# Patient Record
Sex: Female | Born: 1975 | Race: White | Hispanic: No | Marital: Single | Smoking: Never smoker
Health system: Southern US, Community
[De-identification: ages and names within clinical notes are randomized; demographics above are authoritative.]

## PROBLEM LIST (undated history)

## (undated) DIAGNOSIS — I7300 Raynaud's syndrome without gangrene: Secondary | ICD-10-CM

## (undated) HISTORY — PX: WISDOM TOOTH EXTRACTION: SHX21

---

## 2013-07-08 ENCOUNTER — Ambulatory Visit: Payer: Self-pay | Admitting: Internal Medicine

## 2014-05-14 ENCOUNTER — Ambulatory Visit: Payer: Self-pay | Admitting: Emergency Medicine

## 2015-01-15 ENCOUNTER — Ambulatory Visit
Admit: 2015-01-15 | Disposition: A | Discharge status: Home / Self Care | Payer: Self-pay | Attending: Family Medicine | Admitting: Family Medicine

## 2015-06-11 ENCOUNTER — Encounter: Payer: Self-pay | Admitting: *Deleted

## 2015-06-11 ENCOUNTER — Emergency Department
Admission: EM | Admit: 2015-06-11 | Discharge: 2015-06-11 | Disposition: A | Discharge status: Home / Self Care | Payer: BLUE CROSS/BLUE SHIELD | Attending: Emergency Medicine | Admitting: Emergency Medicine

## 2015-06-11 ENCOUNTER — Emergency Department: Payer: BLUE CROSS/BLUE SHIELD

## 2015-06-11 DIAGNOSIS — Z3A11 11 weeks gestation of pregnancy: Secondary | ICD-10-CM | POA: Insufficient documentation

## 2015-06-11 DIAGNOSIS — O039 Complete or unspecified spontaneous abortion without complication: Secondary | ICD-10-CM | POA: Diagnosis not present

## 2015-06-11 DIAGNOSIS — O209 Hemorrhage in early pregnancy, unspecified: Secondary | ICD-10-CM | POA: Diagnosis present

## 2015-06-11 LAB — CBC
HCT: 36.2 % (ref 35.0–47.0)
HEMOGLOBIN: 12 g/dL (ref 12.0–16.0)
MCH: 28.5 pg (ref 26.0–34.0)
MCHC: 33.1 g/dL (ref 32.0–36.0)
MCV: 86 fL (ref 80.0–100.0)
PLATELETS: 220 10*3/uL (ref 150–440)
RBC: 4.21 MIL/uL (ref 3.80–5.20)
RDW: 13 % (ref 11.5–14.5)
WBC: 9.7 10*3/uL (ref 3.6–11.0)

## 2015-06-11 LAB — HCG, QUANTITATIVE, PREGNANCY: HCG, BETA CHAIN, QUANT, S: 30606 m[IU]/mL — AB (ref <5)

## 2015-06-11 NOTE — ED Notes (Signed)
NAD noted at time of D/C. Pt taken to the lobby via wheelchair. SO at bedside for D/C.

## 2015-06-11 NOTE — Discharge Instructions (Signed)
Miscarriage  Please follow up closely with [facility] obstetrics within the next few days.  A miscarriage is the sudden loss of an unborn baby (fetus) before the 20th week of pregnancy. Most miscarriages happen in the first 3 months of pregnancy. Sometimes, it happens before a woman even knows she is pregnant. A miscarriage is also called a "spontaneous miscarriage" or "early pregnancy loss." Having a miscarriage can be an emotional experience. Talk with your caregiver about any questions you may have about miscarrying, the grieving process, and your future pregnancy plans. CAUSES   Problems with the fetal chromosomes that make it impossible for the baby to develop normally. Problems with the baby's genes or chromosomes are most often the result of errors that occur, by chance, as the embryo divides and grows. The problems are not inherited from the parents.  Infection of the cervix or uterus.   Hormone problems.   Problems with the cervix, such as having an incompetent cervix. This is when the tissue in the cervix is not strong enough to hold the pregnancy.   Problems with the uterus, such as an abnormally shaped uterus, uterine fibroids, or congenital abnormalities.   Certain medical conditions.   Smoking, drinking alcohol, or taking illegal drugs.   Trauma.  Often, the cause of a miscarriage is unknown.  SYMPTOMS   Vaginal bleeding or spotting, with or without cramps or pain.  Pain or cramping in the abdomen or lower back.  Passing fluid, tissue, or blood clots from the vagina. DIAGNOSIS  Your caregiver will perform a physical exam. You may also have an ultrasound to confirm the miscarriage. Blood or urine tests may also be ordered. TREATMENT   Sometimes, treatment is not necessary if you naturally pass all the fetal tissue that was in the uterus. If some of the fetus or placenta remains in the body (incomplete miscarriage), tissue left behind may become infected and must be  removed. Usually, a dilation and curettage (D and C) procedure is performed. During a D and C procedure, the cervix is widened (dilated) and any remaining fetal or placental tissue is gently removed from the uterus.  Antibiotic medicines are prescribed if there is an infection. Other medicines may be given to reduce the size of the uterus (contract) if there is a lot of bleeding.  If you have Rh negative blood and your baby was Rh positive, you will need a Rh immunoglobulin shot. This shot will protect any future baby from having Rh blood problems in future pregnancies. HOME CARE INSTRUCTIONS   Your caregiver may order bed rest or may allow you to continue light activity. Resume activity as directed by your caregiver.  Have someone help with home and family responsibilities during this time.   Keep track of the number of sanitary pads you use each day and how soaked (saturated) they are. Write down this information.   Do not use tampons. Do not douche or have sexual intercourse until approved by your caregiver.   Only take over-the-counter or prescription medicines for pain or discomfort as directed by your caregiver.   Do not take aspirin. Aspirin can cause bleeding.   Keep all follow-up appointments with your caregiver.   If you or your partner have problems with grieving, talk to your caregiver or seek counseling to help cope with the pregnancy loss. Allow enough time to grieve before trying to get pregnant again.  SEEK IMMEDIATE MEDICAL CARE IF:   You have severe cramps or pain in your back  or abdomen.  You have a fever.  You pass large blood clots (walnut-sized or larger) ortissue from your vagina. Save any tissue for your caregiver to inspect.   Your bleeding increases.   You have a thick, bad-smelling vaginal discharge.  You become lightheaded, weak, or you faint.   You have chills.  MAKE SURE YOU:  Understand these instructions.  Will watch your  condition.  Will get help right away if you are not doing well or get worse. Document Released: 03/23/2001 Document Revised: 01/22/2013 Document Reviewed: 11/16/2011 [facility] Patient Information 2015 [place], [place]. This information is not intended to replace advice given to you by your health care provider. Make sure you discuss any questions you have with your health care provider.

## 2015-06-11 NOTE — ED Notes (Signed)
Pt returned from US

## 2015-06-11 NOTE — ED Provider Notes (Signed)
[facility] Emergency Department Provider Note  ____________________________________________  Time seen: Approximately 10:49 AM  I have reviewed the triage vital signs and the nursing notes.   HISTORY  Chief Complaint Miscarriage    HPI Cindy Edwards is a 39 y.o. female under the care of midwives at [facility]. She presents today after passing what she describes as a "fetus" at approximately 8 AM. She reports that she woke up this morningwith vaginal bleeding at a rate of about 1 pad per hour, this was associated with passage of a large clot in fetus. She reports she been having some crampy lower abdominal pain since that time, but does not wish any pain medicine.  She reports that she had no ultrasound of the baby last week after having 2 days of vaginal spotting, but was told this was normal. They did not know what caused the bleeding and she did receive a shot of RhoGAM at that time.  No fevers or chills. No nausea no vomiting. Having crampy lower abdominal pain and spotting: About 1 pad per hour for the last 2-3 hours.   History reviewed. No pertinent past medical history.  There are no active problems to display for this patient.   History reviewed. No pertinent past surgical history.  No current outpatient prescriptions on file.  Allergies Review of patient's allergies indicates no known allergies.  No family history on file.  Social History Social History  Substance Use Topics  . Smoking status: Never Smoker   . Smokeless tobacco: None  . Alcohol Use: No    Review of Systems Constitutional: No fever/chills Eyes: No visual changes. ENT: No sore throat. Cardiovascular: Denies chest pain. Respiratory: Denies shortness of breath. Gastrointestinal: No abdominal pain.  No nausea, no vomiting.  No diarrhea.  No constipation. Genitourinary: Negative for dysuria. Does report vaginal bleeding: About 1 pad per hour for the last 3  hours. Musculoskeletal: Negative for back pain. Skin: Negative for rash. Neurological: Negative for headaches, focal weakness or numbness.  10-point ROS otherwise negative.  ____________________________________________   PHYSICAL EXAM:  VITAL SIGNS: ED Triage Vitals  Enc Vitals Group     BP 06/11/15 0907 93/51 mmHg     Pulse Rate 06/11/15 0907 88     Resp 06/11/15 0907 20     Temp 06/11/15 0907 97.6 F (36.4 C)     Temp Source 06/11/15 0907 Oral     SpO2 06/11/15 0907 100 %     Weight 06/11/15 0907 157 lb (71.215 kg)     Height 06/11/15 0907  (1.651 m)     Head Cir --      Peak Flow --      Pain Score 06/11/15 0907 10     Pain Loc --      Pain Edu? --      Excl. in GC? --     Constitutional: Alert and oriented. Well appearing and in no acute distress. Eyes: Conjunctivae are normal. PERRL. EOMI. Head: Atraumatic. Nose: No congestion/rhinnorhea. Mouth/Throat: Mucous membranes are moist.  Oropharynx non-erythematous. Neck: No stridor.   Cardiovascular: Normal rate, regular rhythm. Grossly normal heart sounds.  Good peripheral circulation. Respiratory: Normal respiratory effort.  No retractions. Lungs CTAB. Gastrointestinal: Soft and nontender except for moderate crampy pain to palpation in the suprapubic region. No distention. No abdominal bruits. No CVA tenderness. Musculoskeletal: No lower extremity tenderness nor edema.  No joint effusions. Neurologic:  Normal speech and language. No gross focal neurologic deficits are appreciated.  No gait instability. Skin:  Skin is warm, dry and intact. No rash noted. Psychiatric: Mood and affect are normal. Speech and behavior are normal.  ____________________________________________   LABS (all labs ordered are listed, but only abnormal results are displayed)  Labs Reviewed  HCG, QUANTITATIVE, PREGNANCY - Abnormal; Notable for the following:    hCG, Beta Chain, Quant, S 30606 (*)    All other components within normal  limits  CBC  SURGICAL PATHOLOGY   ____________________________________________  EKG   ____________________________________________  RADIOLOGY  IMPRESSION: 1. Irregular endometrial contents consistent with retained products of conception in the setting of recent spontaneous abortion. 2. Small fluid collection within the endometrium is noted. This appearance could be seen in the setting of an early pregnancy and therefore correlation with history is critical. 3. No suspicious adnexal mass. 4. The salient findings were discussed with Shterna Laramee on 06/11/2015 at 1:18 pm. ____________________________________________   PROCEDURES  Procedure(s) performed: None  Critical Care performed: No  ____________________________________________   INITIAL IMPRESSION / ASSESSMENT AND PLAN / ED COURSE  Pertinent labs & imaging results that were available during my care of the patient were reviewed by me and considered in my medical decision making (see chart for details).  Patient presents with possible miscarriage. Reports a previous ultrasound confirming intrauterine pregnancy week ago. Her symptoms are most concerning for a miscarriage, we will obtain hCG and repeat ultrasound. She is hemodynamically stable and her hemoglobin is normal. No evidence of significant bleeding beyond what may be expected for possible miscarriage. We'll obtain repeat ultrasound imaging, and have sent a specimen which the patient presented with to pathology for evaluation.  Careful return precautions advised, follow up care with [facility] obstetrics within the next 1-2 days advised. Patient and boyfriend agreeable with plan.  [facility] chart reviewed and the patient was administered Rhogam on 8/29.  ----------------------------------------- 1:54 PM on 06/11/2015 -----------------------------------------  Patient awake alert no distress. Result is reviewed and follow-up care with [facility] midwives discussed with patient is very  agreeable. Careful return precautions discussed.  In the setting of her clinical history with the 11 week pregnancy, this appears consistent with miscarriage, but I did discuss the importance of follow-up with the patient. In addition I reviewed ultrasound, labs and clinical history with Dr. Chauncey Cruel also advised this appears consistent with miscarriage. ____________________________________________   FINAL CLINICAL IMPRESSION(S) / ED DIAGNOSES  Final diagnoses:  Spontaneous miscarriage      Sharyn Creamer, MD 06/11/15 1356

## 2015-06-11 NOTE — ED Notes (Signed)
Pt to US.

## 2015-06-11 NOTE — ED Notes (Signed)
Pt is [redacted] weeks pregnant, pt starting with vaginal bleeding Sunday, pt passed fetus and brought it with her to ER, pt is bleeding and cramping

## 2015-06-12 LAB — SURGICAL PATHOLOGY

## 2016-05-12 ENCOUNTER — Ambulatory Visit
Admission: EM | Admit: 2016-05-12 | Discharge: 2016-05-12 | Disposition: A | Discharge status: Home / Self Care | Payer: BLUE CROSS/BLUE SHIELD | Attending: Emergency Medicine | Admitting: Emergency Medicine

## 2016-05-12 ENCOUNTER — Encounter: Payer: Self-pay | Admitting: Emergency Medicine

## 2016-05-12 DIAGNOSIS — L03317 Cellulitis of buttock: Secondary | ICD-10-CM

## 2016-05-12 HISTORY — DX: Raynaud's syndrome without gangrene: I73.00

## 2016-05-12 MED ORDER — SULFAMETHOXAZOLE-TRIMETHOPRIM 800-160 MG PO TABS: 2.0000 | ORAL_TABLET | Freq: Two times a day (BID) | ORAL | 0 refills | Status: DC

## 2016-05-12 MED ORDER — IBUPROFEN 800 MG PO TABS: 800.0000 mg | ORAL_TABLET | Freq: Three times a day (TID) | ORAL | 0 refills | Status: DC

## 2016-05-12 NOTE — ED Triage Notes (Signed)
Pt reports she may have a tick bite on buttocks, and larger than it was. Thinks she has had it for about 2 weeks. Did not pull a tick off this spot but has pulled several other ticks off of herself and her dog.  Pt denies fever or chills.

## 2016-05-12 NOTE — ED Provider Notes (Signed)
HPI  SUBJECTIVE:  Cindy Edwards is a 40 y.o. female who presents with an erythematous mass of gradually increasing size over her right buttock for the past 2 weeks. She initially thought it was a pimple, but is concerned because it is getting larger and it "has not come to a head". She denies any pain. 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 Pulled several ticks off herself about 2 or 3 weeks ago on her scalp, abdomen, ankle. She denies fevers, bodyaches, rash, headache, neck stiffness. She denies any trauma to the area. No conjunctival injection. No antipyretic in the past 6-8 hours. There are no aggravating or alleviating factors. She has tried warm showers for this. Past medical history of Raynaud's and a erythema migrans post tick bite several years ago. She does not have any history of Lyme disease, RMSF. No history of diabetes, hypertension. PMD: Dr.Skaria. LMP: Started Yesterday. She denies any possibility of being pregnant.  Past Medical History:  Diagnosis Date  . Raynaud's phenomenon     History reviewed. No pertinent surgical history.  History reviewed. No pertinent family history.  Social History  Substance Use Topics  . Smoking status: Never Smoker  . Smokeless tobacco: Never Used  . Alcohol use No    No current facility-administered medications for this encounter.   Current Outpatient Prescriptions:  .  Prenatal Multivit-Min-Fe-FA (PRENATAL VITAMINS PO), Take by mouth., Disp: , Rfl:  .  ibuprofen (ADVIL,MOTRIN) 800 MG tablet, Take 1 tablet (800 mg total) by mouth 3 (three) times daily., Disp: 30 tablet, Rfl: 0 .  sulfamethoxazole-trimethoprim (BACTRIM DS,SEPTRA DS) 800-160 MG tablet, Take 2 tablets by mouth 2 (two) times daily., Disp: 28 tablet, Rfl: 0  No Known Allergies   ROS  As noted in HPI.   Physical Exam  BP 127/80   Pulse 83   Temp 98.5 F (36.9 C) (Oral)   Resp 16   Ht 5\' 5"  (1.651  m)   Wt 165 lb (74.8 kg)   LMP 05/11/2016   SpO2 100%   BMI 27.46 kg/m   Constitutional: Well developed, well nourished, no acute distress Eyes:  EOMI, conjunctiva normal bilaterally HENT: Normocephalic, atraumatic,mucus membranes moist Respiratory: Normal inspiratory effort Cardiovascular: Normal rate GI: nondistended skin: Mildly tender area of erythema with minimal induration on her right buttock. Erythema measures approximately 3 x 2.5 cm. This was marked with a marker for reference. She has central erythema with a hyperpigmented center. No pustule. No bull's-eye rash. No expressible purulent drainage. Musculoskeletal: no deformities Neurologic: Alert & oriented x 3, no focal neuro deficits Psychiatric: Speech and behavior appropriate   ED Course   Medications - No data to display  No orders of the defined types were placed in this encounter.   No results found for this or any previous visit (from the past 24 hour(s)). No results found.  ED Clinical Impression  Cellulitis of buttock   ED Assessment/Plan  No bull's-eye rash. No signs or symptom of a tickborne disease at this time. Presentation is most consistent with a small cellulitis, perhaps an abscess. Discussed possibility of doing a needle I&D today, however, patient has opted to do ibuprofen and Bactrim DS twice a day for 7 days,, warm soaks. She'll follow-up here or with her primary care physician if she is not getting significantly better. She will return to her primary care physician or here sooner if she starts getting worse. Discussed signs  and symptoms that should prompt return to the emergency department. Patient agrees with plan.    *This clinic note was created using Dragon dictation software. Therefore, there may be occasional mistakes despite careful proofreading.  ?   Domenick Gong, MD 05/12/16 [phone]

## 2016-09-25 ENCOUNTER — Ambulatory Visit
Admission: EM | Admit: 2016-09-25 | Discharge: 2016-09-25 | Disposition: A | Discharge status: Home / Self Care | Payer: BLUE CROSS/BLUE SHIELD | Attending: Family Medicine | Admitting: Family Medicine

## 2016-09-25 ENCOUNTER — Encounter: Payer: Self-pay | Admitting: Emergency Medicine

## 2016-09-25 DIAGNOSIS — L509 Urticaria, unspecified: Secondary | ICD-10-CM

## 2016-09-25 MED ORDER — PREDNISONE 10 MG PO TABS: 10.0000 mg | ORAL_TABLET | Freq: Every day | ORAL | 0 refills | Status: DC

## 2016-09-25 NOTE — ED Provider Notes (Signed)
[facility]    CSN: [id number] Arrival date & time: 09/25/16  1448     History   Chief Complaint Chief Complaint  Patient presents with  . Allergic Reaction    HPI Cindy Edwards is a 40 y.o. female.   The history is provided by the patient.    Past Medical History:  Diagnosis Date  . Raynaud's phenomenon     There are no active problems to display for this patient.   History reviewed. No pertinent surgical history.  OB History    No data available       Home Medications    Prior to Admission medications   Medication Sig Start Date End Date Taking? Authorizing Provider  ibuprofen (ADVIL,MOTRIN) 800 MG tablet Take 1 tablet (800 mg total) by mouth 3 (three) times daily. 05/12/16   Domenick GongAshley Mortenson, MD  predniSONE (DELTASONE) 10 MG tablet Take 1 tablet (10 mg total) by mouth daily with breakfast. 09/25/16   Duanne Limerickeanna C Jones, MD    Family History History reviewed. No pertinent family history.  Social History Social History  Substance Use Topics  . Smoking status: Never Smoker  . Smokeless tobacco: Never Used  . Alcohol use No     Allergies   Patient has no known allergies.   Review of Systems Review of Systems  Constitutional: Negative.  Negative for chills, fatigue, fever and unexpected weight change.  HENT: Negative for congestion, ear discharge, ear pain, rhinorrhea, sinus pressure, sneezing and sore throat.   Eyes: Negative for photophobia, pain, discharge, redness and itching.  Respiratory: Negative for cough, shortness of breath, wheezing and stridor.   Gastrointestinal: Negative for abdominal pain, blood in stool, constipation, diarrhea, nausea and vomiting.  Endocrine: Negative for cold intolerance, heat intolerance, polydipsia, polyphagia and polyuria.  Genitourinary: Negative for dysuria, flank pain, frequency, hematuria, menstrual problem, pelvic pain, urgency, vaginal bleeding and vaginal discharge.  Musculoskeletal: Negative  for arthralgias, back pain and myalgias.  Skin: Negative for rash.  Allergic/Immunologic: Negative for environmental allergies and food allergies.  Neurological: Negative for dizziness, weakness, light-headedness, numbness and headaches.  Hematological: Negative for adenopathy. Does not bruise/bleed easily.  Psychiatric/Behavioral: Negative for dysphoric mood. The patient is not nervous/anxious.      Physical Exam Triage Vital Signs ED Triage Vitals  Enc Vitals Group     BP 09/25/16 1506 (!) 104/59     Pulse Rate 09/25/16 1506 86     Resp 09/25/16 1506 17     Temp 09/25/16 1506 97.5 F (36.4 C)     Temp Source 09/25/16 1506 Oral     SpO2 09/25/16 1506 95 %     Weight 09/25/16 1505 165 lb (74.8 kg)     Height 09/25/16 1505 5\' 5"  (1.651 m)     Head Circumference --      Peak Flow --      Pain Score 09/25/16 1508 0     Pain Loc --      Pain Edu? --      Excl. in GC? --    No data found.   Updated Vital Signs BP (!) 104/59 (BP Location: Left Arm)   Pulse 86   Temp 97.5 F (36.4 C) (Oral)   Resp 17   Ht 5\' 5"  (1.651 m)   Wt 165 lb (74.8 kg)   LMP 09/21/2016 (Exact Date)   SpO2 95%   BMI 27.46 kg/m   Visual Acuity Right Eye Distance:   Left Eye Distance:  Bilateral Distance:    Right Eye Near:   Left Eye Near:    Bilateral Near:     Physical Exam  Constitutional: No distress.  HENT:  Head: Normocephalic and atraumatic.  Right Ear: External ear normal.  Left Ear: External ear normal.  Nose: Nose normal.  Mouth/Throat: Oropharynx is clear and moist.  Eyes: Conjunctivae and EOM are normal. Pupils are equal, round, and reactive to light. Right eye exhibits no discharge. Left eye exhibits no discharge.  Neck: Normal range of motion. Neck supple. No JVD present. No thyromegaly present.  Cardiovascular: Normal rate, regular rhythm, normal heart sounds and intact distal pulses.  Exam reveals no gallop and no friction rub.   No murmur heard. Pulmonary/Chest: Effort  normal and breath sounds normal.  Abdominal: Soft. Bowel sounds are normal. She exhibits no mass. There is no tenderness. There is no guarding.  Musculoskeletal: Normal range of motion. She exhibits no edema.  Lymphadenopathy:    She has no cervical adenopathy.  Neurological: She is alert. She has normal reflexes.  Skin: Skin is warm and dry. Rash noted. She is not diaphoretic.     UC Treatments / Results  Labs (all labs ordered are listed, but only abnormal results are displayed) Labs Reviewed - No data to display  EKG  EKG Interpretation None       Radiology No results found.  Procedures Procedures (including critical care time)  Medications Ordered in UC Medications - No data to display   Initial Impression / Assessment and Plan / UC Course  I have reviewed the triage vital signs and the nursing notes.  Pertinent labs & imaging results that were available during my care of the patient were reviewed by me and considered in my medical decision making (see chart for details).  Clinical Course     I spent 15 minutes with this patient, More than 50% of that time was spent in face to face education, counseling and care coordination.  Final Clinical Impressions(s) / UC Diagnoses   Final diagnoses:  Urticaria    New Prescriptions New Prescriptions   PREDNISONE (DELTASONE) 10 MG TABLET    Take 1 tablet (10 mg total) by mouth daily with breakfast.     Duanne Limerickeanna C Jones, MD 09/25/16 [phone]

## 2016-09-25 NOTE — ED Triage Notes (Signed)
Patient c/o itchy rash that started around 2pm today on her abdomen and chest.  Patient took 2 tablets Benadryl 45 min ago.

## 2016-09-25 NOTE — ED Triage Notes (Signed)
Patient states that today she took a bath with a new bubble bath and essential oils.

## 2016-10-12 ENCOUNTER — Ambulatory Visit
Admission: EM | Admit: 2016-10-12 | Discharge: 2016-10-12 | Disposition: A | Discharge status: Home / Self Care | Payer: BLUE CROSS/BLUE SHIELD | Attending: Family Medicine | Admitting: Family Medicine

## 2016-10-12 ENCOUNTER — Ambulatory Visit (INDEPENDENT_AMBULATORY_CARE_PROVIDER_SITE_OTHER): Payer: BLUE CROSS/BLUE SHIELD

## 2016-10-12 DIAGNOSIS — R0789 Other chest pain: Secondary | ICD-10-CM

## 2016-10-12 DIAGNOSIS — R938 Abnormal findings on diagnostic imaging of other specified body structures: Secondary | ICD-10-CM

## 2016-10-12 DIAGNOSIS — R9389 Abnormal findings on diagnostic imaging of other specified body structures: Secondary | ICD-10-CM

## 2016-10-12 MED ORDER — KETOROLAC TROMETHAMINE 60 MG/2ML IM SOLN: 60.0000 mg | Freq: Once | INTRAMUSCULAR | Status: DC

## 2016-10-12 MED ORDER — MELOXICAM 15 MG PO TABS: 15.0000 mg | ORAL_TABLET | Freq: Every day | ORAL | 0 refills | Status: DC

## 2016-10-12 MED ORDER — KETOROLAC TROMETHAMINE 60 MG/2ML IM SOLN
60.0000 mg | Freq: Once | INTRAMUSCULAR | Status: AC
  Administered 2016-10-12: 60 mg via INTRAMUSCULAR

## 2016-10-12 NOTE — ED Provider Notes (Signed)
[facility]    CSN: [id number] Arrival date & time: 10/12/16  1148     History   Chief Complaint Chief Complaint  Patient presents with  . Chest Pain    HPI Cindy Edwards is a 41 y.o. female.   Patient is a 41 year old white female with a history of Raynaud's phenomena who's been having left-sided chest wall pain will last week. Last 2-3 days is A lot worse. She has had a history of chest wall pain and costochondritis before. She states that she [facility] when she had it before. She reports Motrin didn't seem to do much for it. This pain started about 2 days ago has gotten progressively worse this morning was much worse. She reports when she states still doesn't move much and taking deep breaths the pain is not too bad in touch takes a deep breath is hurts quite a bit. She is not allergic to any medication. She does not smoke. No pertinent surgical history. No significant past family medical history relevant to today's visit.   The history is provided by the patient. No language interpreter was used.  Chest Pain  Pain location:  Substernal area, L chest and L lateral chest Pain quality: pressure   Pain radiates to:  Does not radiate Pain severity:  Moderate Timing:  Constant Progression:  Worsening Chronicity:  New Relieved by:  Nothing Worsened by:  Nothing Ineffective treatments:  None tried Associated symptoms: abdominal pain and shortness of breath     Past Medical History:  Diagnosis Date  . Raynaud's phenomenon     There are no active problems to display for this patient.   History reviewed. No pertinent surgical history.  OB History    No data available       Home Medications    Prior to Admission medications   Medication Sig Start Date End Date Taking? Authorizing Provider  ibuprofen (ADVIL,MOTRIN) 800 MG tablet Take 1 tablet (800 mg total) by mouth 3 (three) times daily. 05/12/16  Yes Domenick Gong, MD    meloxicam (MOBIC) 15 MG tablet Take 1 tablet (15 mg total) by mouth daily. 10/12/16   Hassan Rowan, MD  predniSONE (DELTASONE) 10 MG tablet Take 1 tablet (10 mg total) by mouth daily with breakfast. 09/25/16   Duanne Limerick, MD    Family History History reviewed. No pertinent family history.  Social History Social History  Substance Use Topics  . Smoking status: Never Smoker  . Smokeless tobacco: Never Used  . Alcohol use No     Allergies   Patient has no known allergies.   Review of Systems Review of Systems  Respiratory: Positive for shortness of breath.   Cardiovascular: Positive for chest pain.  Gastrointestinal: Positive for abdominal pain.  All other systems reviewed and are negative.    Physical Exam Triage Vital Signs ED Triage Vitals  Enc Vitals Group     BP 10/12/16 1324 111/69     Pulse Rate 10/12/16 1324 74     Resp 10/12/16 1324 16     Temp 10/12/16 1324 98.8 F (37.1 C)     Temp Source 10/12/16 1324 Oral     SpO2 10/12/16 1324 100 %     Weight 10/12/16 1322 165 lb (74.8 kg)     Height 10/12/16 1322 5\' 5"  (1.651 m)     Head Circumference --      Peak Flow --      Pain  Score 10/12/16 1322 8     Pain Loc --      Pain Edu? --      Excl. in GC? --    No data found.   Updated Vital Signs BP 111/69 (BP Location: Right Arm)   Pulse 74   Temp 98.8 F (37.1 C) (Oral)   Resp 16   Ht 5\' 5"  (1.651 m)   Wt 165 lb (74.8 kg)   LMP 09/21/2016 (Exact Date) Comment: denies preg  SpO2 100%   BMI 27.46 kg/m   Visual Acuity Right Eye Distance:   Left Eye Distance:   Bilateral Distance:    Right Eye Near:   Left Eye Near:    Bilateral Near:     Physical Exam  Constitutional: She is oriented to person, place, and time. She appears well-developed and well-nourished. No distress.  HENT:  Head: Normocephalic and atraumatic.  Right Ear: External ear normal.  Left Ear: External ear normal.  Eyes: Pupils are equal, round, and reactive to light.  Neck:  Normal range of motion. Neck supple.  Cardiovascular: Normal rate and regular rhythm.   Pulmonary/Chest: Effort normal and breath sounds normal. She has no decreased breath sounds. She has no wheezes. She has no rhonchi. She exhibits tenderness, bony tenderness and swelling.    Patient has tenderness on the left chest wall. Patient was so tender I couldn't even examine her well says that tried to touch her left chest wall to literally jumping off  Musculoskeletal: Normal range of motion.  Neurological: She is alert and oriented to person, place, and time.  Skin: Skin is warm. She is not diaphoretic.  Vitals reviewed.    UC Treatments / Results  Labs (all labs ordered are listed, but only abnormal results are displayed) Labs Reviewed - No data to display  EKG  EKG Interpretation None       Radiology Dg Ribs Unilateral W/chest Left  Result Date: 10/12/2016 CLINICAL DATA:  Left mid to lateral rib pain. EXAM: LEFT RIBS AND CHEST - 3+ VIEW COMPARISON:  None. FINDINGS: The lungs are clear wiithout focal pneumonia, edema, pneumothorax or pleural effusion. The cardiopericardial silhouette is within normal limits for size. Oblique views the left ribs were obtained. Radio-opaque marker has been placed on the skin at the site of patient concern. No evidence for lower left-sided rib fracture. 4 x 8 mm dense nodule left lung base compatible with granuloma. Rounded calcification in the left upper quadrant of the abdomen may be related to a kidney stone or splenic granuloma. IMPRESSION: 1. No acute cardiopulmonary findings. 2. No evidence for lower left rib fracture. Electronically Signed   By: Kennith Center M.D.   On: 10/12/2016 13:59    Procedures Procedures (including critical care time)  Medications Ordered in UC Medications  ketorolac (TORADOL) injection 60 mg (60 mg Intramuscular Given 10/12/16 1426)     Initial Impression / Assessment and Plan / UC Course  I have reviewed the triage  vital signs and the nursing notes.  Pertinent labs & imaging results that were available during my care of the patient were reviewed by me and considered in my medical decision making (see chart for details).  Clinical Course     X-ray shows good granulomas present in the chest x-ray I've strongly recommend since she's never been told she had granulomas she follow up with her PCP and be evaluated for TB and other issues make sure this is not a factor in the granuloma. For  now will treat with Mobic 15 mg 1 tablet a day because the mild discomfort she is having emotional states she still is about a 6 out of 10 is obvious when she moves she has somewhat pain we'll give her 60 Toradol IM. We'll given for today Tuesday and Wednesday. Since she has no pain over that area and only when she moves EKG was not obtained  Final Clinical Impressions(s) / UC Diagnoses   Final diagnoses:  Atypical chest pain  Chest wall pain  Abnormal CXR (chest x-ray)    New Prescriptions Discharge Medication List as of 10/12/2016  2:37 PM      Note: This dictation was prepared with Dragon dictation along with smaller phrase technology. Any transcriptional errors that result from this process are unintentional.    Hassan RowanEugene Jenay Morici, MD 10/12/16 1520

## 2016-10-12 NOTE — Discharge Instructions (Signed)
Follow-up with PCP to evaluate patient for granulomas that was seen on chest x-ray as an incidental finding

## 2016-10-12 NOTE — ED Triage Notes (Signed)
Patient complains of possible costochondritis that started around 1 week ago and has been worsening. Patient states that she has pain in her left rib area with movement. Patient states that she had several problems with things jerking her and could have caused the pain. Patient states that pain is also worth with deep breath.

## 2017-03-14 ENCOUNTER — Ambulatory Visit (INDEPENDENT_AMBULATORY_CARE_PROVIDER_SITE_OTHER): Payer: BLUE CROSS/BLUE SHIELD

## 2017-03-14 ENCOUNTER — Ambulatory Visit
Admission: EM | Admit: 2017-03-14 | Discharge: 2017-03-14 | Disposition: A | Discharge status: Home / Self Care | Payer: BLUE CROSS/BLUE SHIELD | Attending: Family Medicine | Admitting: Family Medicine

## 2017-03-14 DIAGNOSIS — R21 Rash and other nonspecific skin eruption: Secondary | ICD-10-CM

## 2017-03-14 DIAGNOSIS — L237 Allergic contact dermatitis due to plants, except food: Secondary | ICD-10-CM | POA: Diagnosis not present

## 2017-03-14 DIAGNOSIS — Z23 Encounter for immunization: Secondary | ICD-10-CM | POA: Diagnosis not present

## 2017-03-14 DIAGNOSIS — M79602 Pain in left arm: Secondary | ICD-10-CM

## 2017-03-14 DIAGNOSIS — S30860A Insect bite (nonvenomous) of lower back and pelvis, initial encounter: Secondary | ICD-10-CM | POA: Diagnosis not present

## 2017-03-14 DIAGNOSIS — W57XXXA Bitten or stung by nonvenomous insect and other nonvenomous arthropods, initial encounter: Secondary | ICD-10-CM | POA: Diagnosis not present

## 2017-03-14 LAB — CBC WITH DIFFERENTIAL/PLATELET
BASOS ABS: 0.1 10*3/uL (ref 0–0.1)
Basophils Relative: 1 %
Eosinophils Absolute: 0 10*3/uL (ref 0–0.7)
Eosinophils Relative: 0 %
HEMATOCRIT: 40.3 % (ref 35.0–47.0)
Hemoglobin: 13.5 g/dL (ref 12.0–16.0)
LYMPHS ABS: 0.7 10*3/uL — AB (ref 1.0–3.6)
Lymphocytes Relative: 12 %
MCH: 28.8 pg (ref 26.0–34.0)
MCHC: 33.5 g/dL (ref 32.0–36.0)
MCV: 85.9 fL (ref 80.0–100.0)
Monocytes Absolute: 0.3 10*3/uL (ref 0.2–0.9)
Monocytes Relative: 5 %
NEUTROS ABS: 4.7 10*3/uL (ref 1.4–6.5)
Neutrophils Relative %: 82 %
Platelets: 204 10*3/uL (ref 150–440)
RBC: 4.69 MIL/uL (ref 3.80–5.20)
RDW: 13.7 % (ref 11.5–14.5)
WBC: 5.8 10*3/uL (ref 3.6–11.0)

## 2017-03-14 MED ORDER — PREDNISONE 10 MG (21) PO TBPK: ORAL_TABLET | ORAL | 0 refills | Status: DC

## 2017-03-14 MED ORDER — MELOXICAM 15 MG PO TABS: 15.0000 mg | ORAL_TABLET | Freq: Every day | ORAL | 0 refills | Status: DC

## 2017-03-14 MED ORDER — TETANUS-DIPHTH-ACELL PERTUSSIS 5-2.5-18.5 LF-MCG/0.5 IM SUSP
0.5000 mL | Freq: Once | INTRAMUSCULAR | Status: AC
  Administered 2017-03-14: 0.5 mL via INTRAMUSCULAR

## 2017-03-14 MED ORDER — KETOROLAC TROMETHAMINE 60 MG/2ML IM SOLN
60.0000 mg | Freq: Once | INTRAMUSCULAR | Status: AC
  Administered 2017-03-14: 60 mg via INTRAMUSCULAR

## 2017-03-14 MED ORDER — DOXYCYCLINE HYCLATE 100 MG PO CAPS: 100.0000 mg | ORAL_CAPSULE | Freq: Two times a day (BID) | ORAL | 0 refills | Status: DC

## 2017-03-14 NOTE — ED Triage Notes (Signed)
Pt reports she thought she pulled muscle in her left forearm (works as a Visual merchandiserfarmer) and has had fever/chills since Saturday.Right lower back reports poison ivy and right upper back also has an area of breakout.

## 2017-03-14 NOTE — ED Provider Notes (Signed)
[facility]    CSN: [id number] Arrival date & time: 03/14/17  1229     History   Chief Complaint Chief Complaint  Patient presents with  . Fever  . Arm Pain    HPI Cindy Edwards is a 41 y.o. female.   Patient presents with multiple issues multiple problems. She reports having exposure to combat and recently undergoing rabies prophylaxis there was no bile bite L. She reports also poison ivy or poison oak contact as well she is using cortisone cream for lesion in her upper right hip. She states that she'sa new lesion on her right upper back which she thinks is insect bite. She found a engorged tick in her bed about 3 days ago she flushed down the toilet and she found a rather large, portion of the head yesterday. She's been having a fever chills and she's also has had squeezing pain in her right forearm as well. 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 She denies any direct trauma to the left forearm but states that this happened on Saturday she has some discomfort she went to the gym on Sunday and did use her left forearm and now she has excruciating pain in the left forearm. She reports having excruciating headache and neck pain as well. She does not smoke no known drug allergies no pertinent family medical history and no history of [place] spotted fever before in the past. Only surgery she acknowledges wasn't teeth extractions. And she's had a history of Raynaud's phenomena in the past.   The history is provided by the patient and a significant other. No language interpreter was used.  Fever  Temp source:  Subjective Severity:  Moderate Onset quality:  Sudden Progression:  Worsening Chronicity:  New Relieved by:  Nothing Worsened by:  Nothing Ineffective treatments:  None tried Associated symptoms: congestion,  headaches, myalgias and rash   Risk factors: no immunosuppression   Arm Pain  Associated symptoms include headaches.    Past Medical History:  Diagnosis Date  . Raynaud's phenomenon     There are no active problems to display for this patient.   Past Surgical History:  Procedure Laterality Date  . WISDOM TOOTH EXTRACTION      OB History    No data available       Home Medications    Prior to Admission medications   Medication Sig Start Date End Date Taking? Authorizing Provider  norethindrone-ethinyl estradiol (JUNEL FE,GILDESS FE,LOESTRIN FE) 1-20 MG-MCG tablet Take 1 tablet by mouth daily.   Yes [provider]  doxycycline (VIBRAMYCIN) 100 MG capsule Take 1 capsule (100 mg total) by mouth 2 (two) times daily. 03/14/17   Hassan RowanWade, Saamir Armstrong, MD  ibuprofen (ADVIL,MOTRIN) 800 MG tablet Take 1 tablet (800 mg total) by mouth 3 (three) times daily. 05/12/16   Domenick GongMortenson, Ashley, MD  meloxicam (MOBIC) 15 MG tablet Take 1 tablet (15 mg total) by mouth daily. 10/12/16   Hassan RowanWade, Feven Alderfer, MD  meloxicam (MOBIC) 15 MG tablet Take 1 tablet (15 mg total) by mouth daily. 03/14/17   Hassan RowanWade, Charlcie Prisco, MD  predniSONE (DELTASONE) 10 MG tablet Take 1 tablet (10 mg total) by mouth daily with breakfast. 09/25/16   Duanne LimerickJones, Deanna C, MD  predniSONE (STERAPRED UNI-PAK 21 TAB) 10 MG (21) TBPK tablet 6 tabs  day 1 and 2, 5 tabs day 3 and 4, 4 tabs day 5 and 6, 3 tabs day 7 and 8, 2 tabs day 9 and 10, 1 tab day 11 and 12. Take orally 03/14/17   Hassan Rowan, MD    Family History History reviewed. No pertinent family history.  Social History Social History  Substance Use Topics  . Smoking status: Never Smoker  . Smokeless tobacco: Never Used  . Alcohol use Yes     Comment: daily 2 per day     Allergies   Patient has no known allergies.   Review of Systems Review of Systems  Constitutional: Positive for fever.  HENT: Positive for congestion.   Musculoskeletal: Positive for myalgias.  Skin: Positive for  rash.  Neurological: Positive for headaches.  All other systems reviewed and are negative.    Physical Exam Triage Vital Signs ED Triage Vitals  Enc Vitals Group     BP 03/14/17 1352 126/74     Pulse Rate 03/14/17 1352 (!) 104     Resp 03/14/17 1352 18     Temp 03/14/17 1352 99.5 F (37.5 C)     Temp Source 03/14/17 1352 Oral     SpO2 03/14/17 1352 100 %     Weight 03/14/17 1349 170 lb (77.1 kg)     Height 03/14/17 1349 5\' 5"  (1.651 m)     Head Circumference --      Peak Flow --      Pain Score 03/14/17 1349 7     Pain Loc --      Pain Edu? --      Excl. in GC? --    No data found.   Updated Vital Signs BP 126/74 (BP Location: Right Arm)   Pulse (!) 104   Temp 99.5 F (37.5 C) (Oral)   Resp 18   Ht 5\' 5"  (1.651 m)   Wt 170 lb (77.1 kg)   LMP 03/06/2017 Comment: denies preg  SpO2 100%   BMI 28.29 kg/m   Visual Acuity Right Eye Distance:   Left Eye Distance:   Bilateral Distance:    Right Eye Near:   Left Eye Near:    Bilateral Near:     Physical Exam  Constitutional: She is oriented to person, place, and time. She appears well-developed and well-nourished.  HENT:  Head: Normocephalic and atraumatic.  Right Ear: External ear normal.  Left Ear: External ear normal.  Mouth/Throat: Oropharynx is clear and moist.  Eyes: Conjunctivae are normal. Pupils are equal, round, and reactive to light. Right eye exhibits no discharge. Left eye exhibits no discharge.  Neck: Normal range of motion. Neck supple. No tracheal deviation present.  Pulmonary/Chest: Breath sounds normal.  Musculoskeletal: She exhibits tenderness.       Left forearm: She exhibits tenderness and bony tenderness.       Arms: Patient with no tenderness in the anatomical snuffbox but she does have marked tenderness along the distal radial bone. Simple palpation made patient jumped out of the chair screaming in pain  Lymphadenopathy:    She has no cervical adenopathy.  Neurological: She is alert  and oriented to person, place, and time. No cranial nerve deficit.  Skin: Skin is warm. Rash noted.  Psychiatric: She has a normal mood and affect.  Vitals reviewed.    UC Treatments / Results  Labs (all labs ordered are listed, but only abnormal results are displayed) Labs Reviewed  CBC WITH DIFFERENTIAL/PLATELET - Abnormal; Notable for the  following:       Result Value   Lymphs Abs 0.7 (*)    All other components within normal limits  ROCKY MTN SPOTTED FVR ABS PNL(IGG+IGM)  B. BURGDORFI ANTIBODIES    EKG  EKG Interpretation None       Radiology Dg Forearm Left  Result Date: 03/14/2017 CLINICAL DATA:  Injury to the forearm from 2 days ago after lifting heavy weights. EXAM: LEFT FOREARM - 2 VIEW COMPARISON:  None. FINDINGS: There is no evidence of fracture or other focal bone lesions. Soft tissues are unremarkable. IMPRESSION: Negative. Electronically Signed   By: Signa Kell M.D.   On: 03/14/2017 15:13    Procedures Procedures (including critical care time)  Medications Ordered in UC Medications  ketorolac (TORADOL) injection 60 mg (60 mg Intramuscular Given 03/14/17 1419)  Tdap (BOOSTRIX) injection 0.5 mL (0.5 mLs Intramuscular Given 03/14/17 1440)     Initial Impression / Assessment and Plan / UC Course  I have reviewed the triage vital signs and the nursing notes.  Pertinent labs & imaging results that were available during my care of the patient were reviewed by me and considered in my medical decision making (see chart for details).    Due to the multiplicity of problems we will x-ray her left forearm going to recommend a tetanus to cover her for the foreign vermin in her bed, will place her on doxycycline for possible tick bite and skin lesion will also place on 12 day course of prednisone for appears to be a contact dermatitis over her right hip and Mobic for the joint pain as well will place on Bactroban ointment for the lesion on her right upper back strongly  recommend follow-up for PCP if not better in the next 48 hours will give a work note for today and tomorrow. I'll see if x-ray is positive we'll need to address that over the left forearm and probably will recommend a sling for protection   Final Clinical Impressions(s) / UC Diagnoses   Final diagnoses:  Pain of left upper extremity  Insect bite, initial encounter  Tick bite of back, initial encounter  Rash and nonspecific skin eruption  Allergic contact dermatitis due to plants, except food    New Prescriptions Discharge Medication List as of 03/14/2017  3:25 PM    START taking these medications   Details  doxycycline (VIBRAMYCIN) 100 MG capsule Take 1 capsule (100 mg total) by mouth 2 (two) times daily., Starting Mon 03/14/2017, Normal    !! meloxicam (MOBIC) 15 MG tablet Take 1 tablet (15 mg total) by mouth daily., Starting Mon 03/14/2017, Normal    predniSONE (STERAPRED UNI-PAK 21 TAB) 10 MG (21) TBPK tablet 6 tabs day 1 and 2, 5 tabs day 3 and 4, 4 tabs day 5 and 6, 3 tabs day 7 and 8, 2 tabs day 9 and 10, 1 tab day 11 and 12. Take orally, Normal     !! - Potential duplicate medications found. Please discuss with provider.       Hassan Rowan, MD 03/14/17 2137

## 2017-03-15 LAB — B. BURGDORFI ANTIBODIES

## 2017-03-16 LAB — RMSF, IGG, IFA

## 2017-03-16 LAB — ROCKY MTN SPOTTED FVR ABS PNL(IGG+IGM)
RMSF IgG: UNDETERMINED
RMSF IgM: 0.48 index (ref 0.00–0.89)

## 2017-03-17 ENCOUNTER — Telehealth: Payer: Self-pay

## 2017-03-17 NOTE — Telephone Encounter (Signed)
Patient was informed of negative test results.

## 2017-07-24 ENCOUNTER — Ambulatory Visit
Admission: EM | Admit: 2017-07-24 | Discharge: 2017-07-24 | Disposition: A | Discharge status: Home / Self Care | Payer: BLUE CROSS/BLUE SHIELD | Attending: Emergency Medicine | Admitting: Emergency Medicine

## 2017-07-24 ENCOUNTER — Ambulatory Visit (INDEPENDENT_AMBULATORY_CARE_PROVIDER_SITE_OTHER): Payer: BLUE CROSS/BLUE SHIELD

## 2017-07-24 ENCOUNTER — Encounter: Payer: Self-pay | Admitting: Emergency Medicine

## 2017-07-24 DIAGNOSIS — M1250 Traumatic arthropathy, unspecified site: Secondary | ICD-10-CM

## 2017-07-24 DIAGNOSIS — M79645 Pain in left finger(s): Secondary | ICD-10-CM | POA: Diagnosis not present

## 2017-07-24 DIAGNOSIS — S63615A Unspecified sprain of left ring finger, initial encounter: Secondary | ICD-10-CM

## 2017-07-24 MED ORDER — NAPROXEN 500 MG PO TABS: 500.0000 mg | ORAL_TABLET | Freq: Two times a day (BID) | ORAL | 0 refills | Status: DC

## 2017-07-24 NOTE — ED Provider Notes (Signed)
[facility]    CSN: [id number] Arrival date & time: 07/24/17  1056     History   Chief Complaint Chief Complaint  Patient presents with  . Finger Injury    left 4th finger    HPI Cindy Edwards is a 41 y.o. female.   HPI  Sit 41 year old female who presents with her nondominant left ring finger. She states that she was walking her dog was in the process of changing the lead to her other hand when the dog lurched to take off after another dog and injured the tip of her left ring finger. Since that time she's been buddy taping it to her middle finger and using a coffee stirrer as a splint but continues to have discomfort. Most her pain is over the DIP area but also some less pain over the PIP. There is no swelling ecchymosis or erythema. There is no breaks in the skin.        Past Medical History:  Diagnosis Date  . Raynaud's phenomenon     There are no active problems to display for this patient.   Past Surgical History:  Procedure Laterality Date  . WISDOM TOOTH EXTRACTION      OB History    No data available       Home Medications    Prior to Admission medications   Medication Sig Start Date End Date Taking? Authorizing Provider  ibuprofen (ADVIL,MOTRIN) 800 MG tablet Take 1 tablet (800 mg total) by mouth 3 (three) times daily. 05/12/16   Domenick Gong, MD  naproxen (NAPROSYN) 500 MG tablet Take 1 tablet (500 mg total) by mouth 2 (two) times daily. 07/24/17   Lutricia Feil, PA-C  norethindrone-ethinyl estradiol (JUNEL FE,GILDESS FE,LOESTRIN FE) 1-20 MG-MCG tablet Take 1 tablet by mouth daily.    [provider]    Family History History reviewed. No pertinent family history.  Social History Social History  Substance Use Topics  . Smoking status: Never Smoker  . Smokeless tobacco: Never Used  . Alcohol use Yes     Comment: daily 2 per day     Allergies   Patient has no known allergies.   Review of  Systems Review of Systems  Constitutional: Positive for activity change. Negative for appetite change, chills, fatigue and fever.  Musculoskeletal: Positive for arthralgias.  All other systems reviewed and are negative.    Physical Exam Triage Vital Signs ED Triage Vitals  Enc Vitals Group     BP 07/24/17 1156 131/70     Pulse Rate 07/24/17 1156 70     Resp 07/24/17 1156 14     Temp 07/24/17 1156 98.5 F (36.9 C)     Temp Source 07/24/17 1156 Oral     SpO2 07/24/17 1156 100 %     Weight 07/24/17 1154 170 lb (77.1 kg)     Height 07/24/17 1154  (1.651 m)     Head Circumference --      Peak Flow --      Pain Score 07/24/17 1155 2     Pain Loc --      Pain Edu? --      Excl. in GC? --    No data found.   Updated Vital Signs BP 131/70 (BP Location: Right Arm)   Pulse 70   Temp 98.5 F (36.9 C) (Oral)   Resp 14   Ht  (1.651 m)   Wt 170 lb (77.1 kg)   LMP  06/27/2017 (Approximate)   SpO2 100%   BMI 28.29 kg/m   Visual Acuity Right Eye Distance:   Left Eye Distance:   Bilateral Distance:    Right Eye Near:   Left Eye Near:    Bilateral Near:     Physical Exam  Constitutional: She is oriented to person, place, and time. She appears well-developed and well-nourished. No distress.  HENT:  Head: Normocephalic.  Eyes: Pupils are equal, round, and reactive to light.  Neck: Normal range of motion.  Musculoskeletal: She exhibits tenderness. She exhibits no edema or deformity.  Examination of the left nondominant ring finger shows no swelling ecchymosis erythema or skin breaks. Patient has decreased range of motion slightly to flexion but demonstrates full extension. Is no collateral ligament laxity of the DIP or PIP joints. FDS FDP is strong. Is able to extend her finger against resistance. There is no hyperextension of the PIP joint of. There is no evidence of extensor tendon disruption i.e. mallet finger. She has maximal tenderness to compression of the DIP and  less so of the PIP joints. Anterior-posterior and ulnar radial.  Neurological: She is alert and oriented to person, place, and time.  Skin: Skin is warm and dry. She is not diaphoretic.  Psychiatric: She has a normal mood and affect. Her behavior is normal. Judgment and thought content normal.  Nursing note and vitals reviewed.    UC Treatments / Results  Labs (all labs ordered are listed, but only abnormal results are displayed) Labs Reviewed - No data to display  EKG  EKG Interpretation None       Radiology Dg Finger Ring Left  Result Date: 07/24/2017 CLINICAL DATA:  Left fourth finger pain. EXAM: LEFT RING FINGER 2+V COMPARISON:  None. FINDINGS: There is no evidence of fracture or dislocation. There is no evidence of arthropathy or other focal bone abnormality. Soft tissues are unremarkable. IMPRESSION: Negative. Electronically Signed   By: Ted Mcalpine M.D.   On: 07/24/2017 12:13    Procedures  Procedures (including critical care time) A stack splint was applied to the left DIP joint.  Medications Ordered in UC Medications - No data to display   Initial Impression / Assessment and Plan / UC Course  I have reviewed the triage vital signs and the nursing notes.  Pertinent labs & imaging results that were available during my care of the patient were reviewed by me and considered in my medical decision making (see chart for details).     Plan: 1. Test/x-ray results and diagnosis reviewed with patient 2. rx as per orders; risks, benefits, potential side effects reviewed with patient 3. Recommend supportive treatment with ice and elevation to control pain. Demonstrated early range of motion exercises to prevent joint stiffness. Will a prescribed Naprosyn 500 mg twice a day with food. Patient was placed in a stack splint to the ring DIP. She will use this for activities and may take it off during periods of quiet or personal care. If she is not improving in 1-2  months recommend follow-up with an orthopedic surgeon. 4. F/u prn if symptoms worsen or don't improve   Final Clinical Impressions(s) / UC Diagnoses   Final diagnoses:  Traumatic arthritis  Sprain of left ring finger, unspecified site of finger, initial encounter    New Prescriptions Discharge Medication List as of 07/24/2017 12:36 PM    START taking these medications   Details  naproxen (NAPROSYN) 500 MG tablet Take 1 tablet (500 mg total) by mouth  2 (two) times daily., Starting Sun 07/24/2017, Normal         Controlled Substance Prescriptions [place] Controlled Substance Registry consulted? Not Applicable   Lutricia Feil, PA-C 07/24/17 1251

## 2017-07-24 NOTE — ED Triage Notes (Signed)
Patient states that she was walking her dog 3 weeks ago and her dog jerked the leash and injured her left 4th finger.

## 2017-07-24 NOTE — Discharge Instructions (Signed)
Wear splint for activities. May remove for personal care. Perform range of motion exercises to your finger several times each hour as demonstrated. Use ice when necessary to control pain and swelling 20 minutes out of every 2 hours 4-5 times daily. Use Naprosyn 2 times daily; take with food. If not improving in 4-6 weeks consider following up with orthopedic surgery

## 2017-08-20 ENCOUNTER — Other Ambulatory Visit: Payer: Self-pay

## 2017-08-20 ENCOUNTER — Ambulatory Visit (INDEPENDENT_AMBULATORY_CARE_PROVIDER_SITE_OTHER): Payer: BLUE CROSS/BLUE SHIELD

## 2017-08-20 ENCOUNTER — Ambulatory Visit
Admission: EM | Admit: 2017-08-20 | Discharge: 2017-08-20 | Disposition: A | Discharge status: Home / Self Care | Payer: BLUE CROSS/BLUE SHIELD | Attending: Family Medicine | Admitting: Family Medicine

## 2017-08-20 DIAGNOSIS — M79605 Pain in left leg: Secondary | ICD-10-CM

## 2017-08-20 MED ORDER — NAPROXEN 500 MG PO TABS: 500.0000 mg | ORAL_TABLET | Freq: Two times a day (BID) | ORAL | 0 refills | Status: DC

## 2017-08-20 NOTE — ED Provider Notes (Signed)
[facility]    CSN: [id number] Arrival date & time: 08/20/17  0825     History   Chief Complaint Chief Complaint  Patient presents with  . Leg Pain    HPI Cindy McmurraySandra Marie Edwards is a 41 y.o. female.   Patient is a 41 year old female who presents with complaint of left shin pain that began approximately 2 weeks ago. Patient states she was walking her dog about 2 weeks ago and stumbled and fell, landing on her knees. She states the pain started the next day. She does report she is a runner and has been running for over 20 years. She has had shin splints in the past but the last time was 10 years ago. 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 Patient states she has not run in the last 10 days because of the pain. She is not taking oral meds but has been icing and elevating her leg after work. She reports is not painful to walk but after a day on her feet the pain is worse.  Patient reports running 6 miles a week when she runs.      Past Medical History:  Diagnosis Date  . Raynaud's phenomenon     There are no active problems to display for this patient.   Past Surgical History:  Procedure Laterality Date  . WISDOM TOOTH EXTRACTION      OB History    No data available       Home Medications    Prior to Admission medications   Medication Sig Start Date End Date Taking? Authorizing Provider  amLODipine (NORVASC) 2.5 MG tablet Take 2.5 mg daily by mouth.   Yes [provider]  norethindrone-ethinyl estradiol (JUNEL FE,GILDESS FE,LOESTRIN FE) 1-20 MG-MCG tablet Take 1 tablet by mouth daily.   Yes [provider]  naproxen (NAPROSYN) 500 MG tablet Take 1 tablet (500 mg total) by mouth 2 (two) times daily. 07/24/17   Lutricia Feiloemer, William P, PA-C  naproxen (NAPROSYN) 500 MG tablet Take 1 tablet (500 mg total) 2 (two) times daily by mouth. 08/20/17   Candis SchatzHarris, Dayyan Krist D, PA-C     Family History Family History  Problem Relation Age of Onset  . Hypertension Father     Social History Social History   Tobacco Use  . Smoking status: Never Smoker  . Smokeless tobacco: Never Used  Substance Use Topics  . Alcohol use: Yes    Comment: daily 2 per day  . Drug use: No     Allergies   Patient has no known allergies.   Review of Systems Review of Systems  As noted above in history of present illness. Other systems reviewed and found negative.   Physical Exam Triage Vital Signs ED Triage Vitals  Enc Vitals Group     BP 08/20/17 0903 116/64     Pulse Rate 08/20/17 0903 82     Resp 08/20/17 0903 15     Temp 08/20/17 0903 98.1 F (36.7 C)     Temp Source 08/20/17 0903 Oral     SpO2 08/20/17 0903 100 %     Weight 08/20/17 0901 170 lb (77.1 kg)     Height 08/20/17 0901 5\' 5"  (1.651 m)     Head Circumference --      Peak Flow --      Pain Score 08/20/17 0901 4  Pain Loc --      Pain Edu? --      Excl. in GC? --    No data found.  Updated Vital Signs BP 116/64 (BP Location: Right Arm)   Pulse 82   Temp 98.1 F (36.7 C) (Oral)   Resp 15   Ht 5\' 5"  (1.651 m)   Wt 170 lb (77.1 kg)   LMP 07/26/2017   SpO2 100%   BMI 28.29 kg/m   Visual Acuity Right Eye Distance:   Left Eye Distance:   Bilateral Distance:    Right Eye Near:   Left Eye Near:    Bilateral Near:     Physical Exam  Constitutional: She appears well-developed and well-nourished. No distress.  Musculoskeletal:       Left knee: She exhibits swelling (LLE mild swelling, noted more based on leg marks left by sock. to less extent on R) and bony tenderness. She exhibits normal range of motion. No patellar tendon tenderness noted.       Legs:    UC Treatments / Results  Labs (all labs ordered are listed, but only abnormal results are displayed) Labs Reviewed - No data to display  EKG  EKG Interpretation None       Radiology Dg Tibia/fibula Left  Result Date:  08/20/2017 CLINICAL DATA:  Lower extremity pain for 1 week. EXAM: LEFT TIBIA AND FIBULA - 2 VIEW COMPARISON:  None. FINDINGS: There is no evidence of displaced fracture. There is circumferential cortical thickening of the mid fibula. Soft tissues are normal. IMPRESSION: Circumferential cortical thickening of the mid fibular shaft, which may represent periosteal reaction such as seen in infectious/inflammatory conditions, or be a sequela of repeated microfractures, such as seen in stress fractures. No evidence of displaced fractures. Electronically Signed   By: Ted Mcalpineobrinka  Dimitrova M.D.   On: 08/20/2017 10:19    Procedures Procedures (including critical care time)  Medications Ordered in UC Medications - No data to display   Initial Impression / Assessment and Plan / UC Course  I have reviewed the triage vital signs and the nursing notes.  Pertinent labs & imaging results that were available during my care of the patient were reviewed by me and considered in my medical decision making (see chart for details).     Patient with left lower extremity pain with diffuse tenderness over the tibia but more of point tenderness along the mid tibia. Question of shin splints versus stress fractures given her running history and complaint.  Final Clinical Impressions(s) / UC Diagnoses   Final diagnoses:  Left leg pain   X-ray with evidence of possible stress fractures to the left fibula but nothing would be left tibia. Pain seems out of proportion with exam. Patient given prescription for naproxen 500 mg twice a day advised to continue with no running until she is seen by orthopedics. Also recommended she could wrap her lower leg and the elastic bandage for support, given until she is seen by orthopedics. Patient verbalized understanding is in agreement with plan.  ED Discharge Orders        Ordered    naproxen (NAPROSYN) 500 MG tablet  2 times daily     08/20/17 1031       Controlled Substance  Prescriptions [place] Controlled Substance Registry consulted? Not Applicable   Candis SchatzHarris, Camika Marsico D, PA-C 08/20/17 1338

## 2017-08-20 NOTE — ED Triage Notes (Signed)
Patient complains of left shin pain. Patient states that she has not had any known injury to the area. Patient states that she feels like this is more muscular. Patient states that if some area is touched its much worse. Patient states that she is a runner. Patient states that the shin pain started around 1 week ago.

## 2017-08-20 NOTE — Discharge Instructions (Signed)
-  naproxen: one tablet twice a day for pain -follow up with orthopedics -no running until seen by orthopedics

## 2018-01-03 IMAGING — CR DG FINGER RING 2+V*L*
3 series · 3 of 3 positions shown · non-contrast
Comparison: None.

CLINICAL DATA: Left fourth finger pain.

EXAM:
LEFT RING FINGER 2+V

[finger ap]
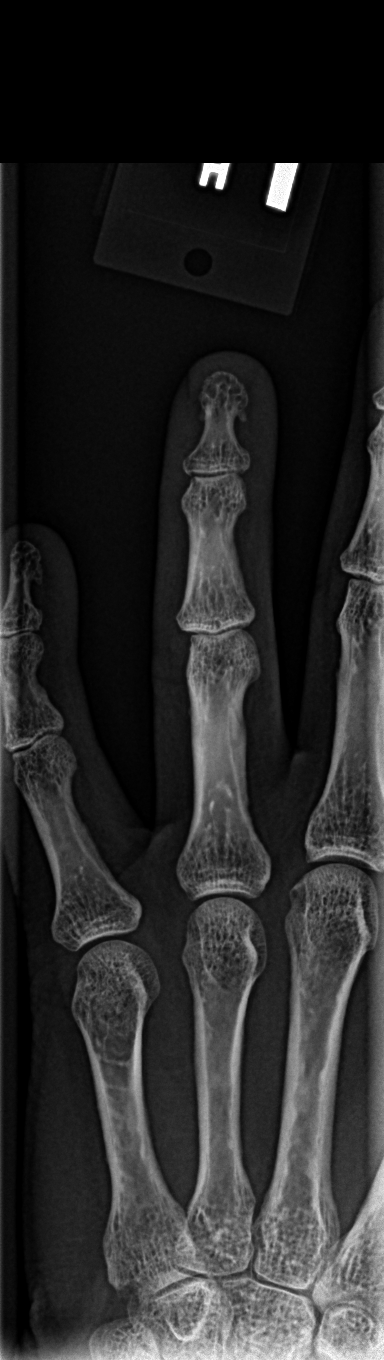

[finger obl]
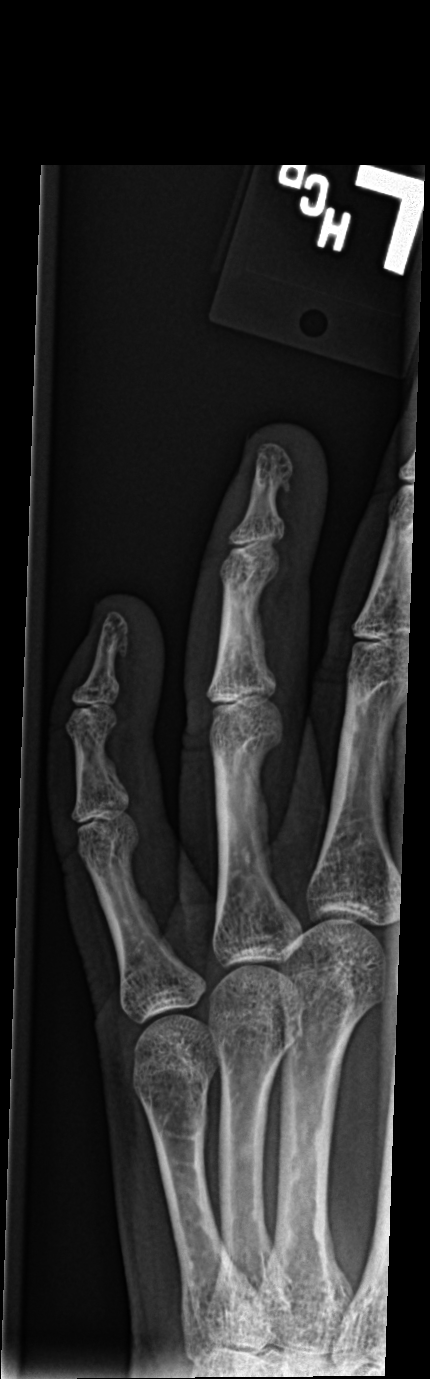

[finger lat]
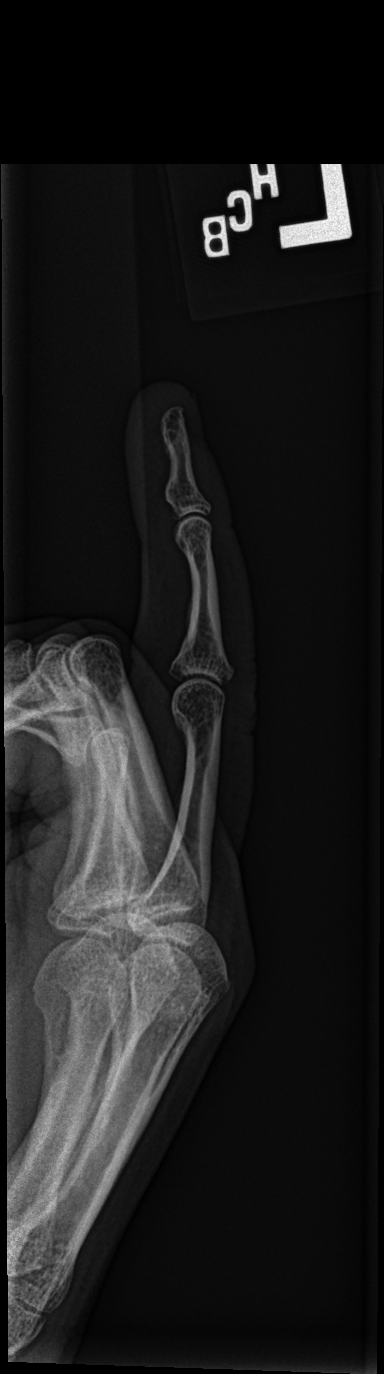

[3 of 3 positions shown; findings below may reference images not displayed]

FINDINGS: There is no evidence of fracture or dislocation. There is no
evidence of arthropathy or other focal bone abnormality. Soft
tissues are unremarkable.
IMPRESSION: Negative.

## 2018-01-30 IMAGING — CR DG TIBIA/FIBULA 2V*L*
2 series · 2 of 2 positions shown · non-contrast
Comparison: None.

CLINICAL DATA: Lower extremity pain for 1 week.

EXAM:
LEFT TIBIA AND FIBULA - 2 VIEW

[tibia ap]
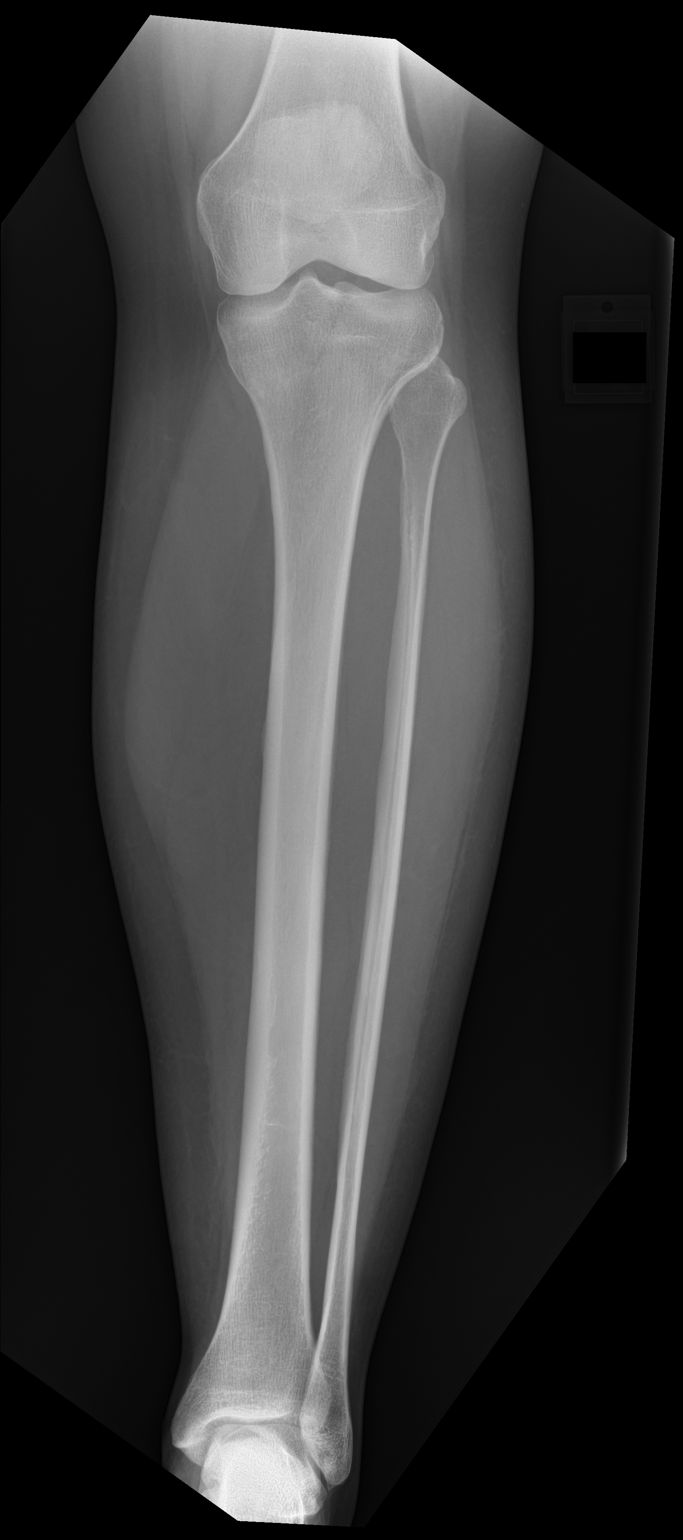

[tibia lat]
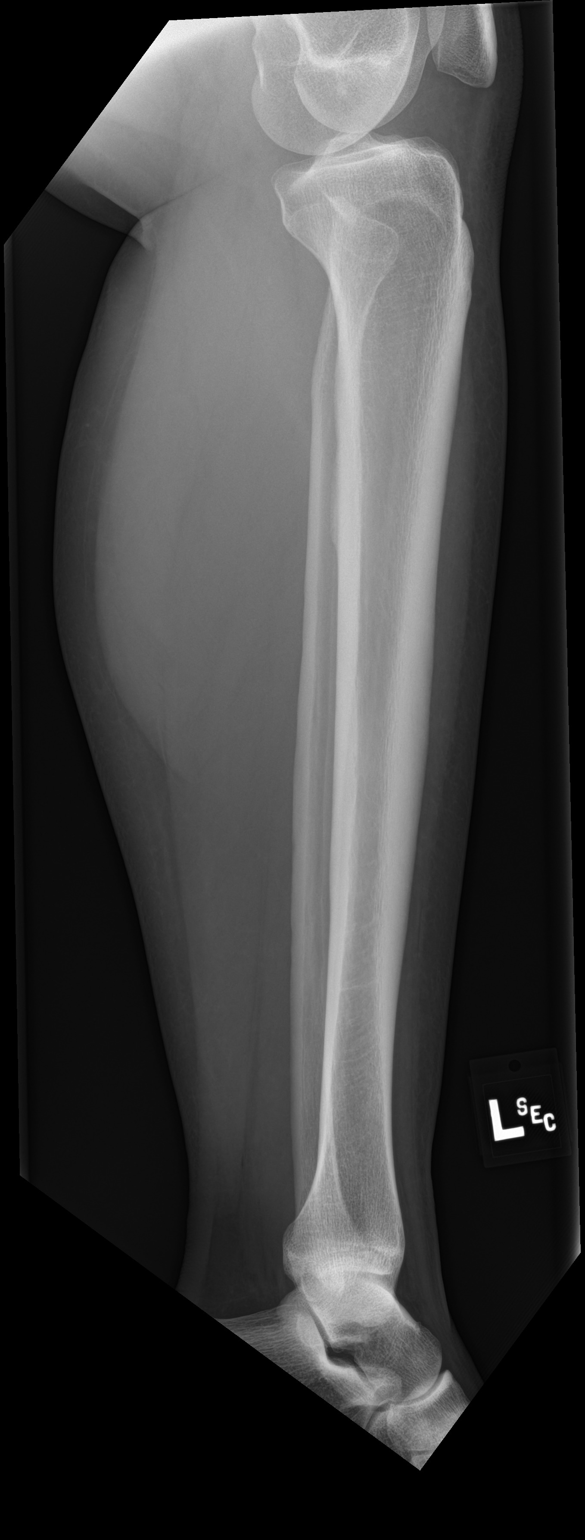

[2 of 2 positions shown; findings below may reference images not displayed]

FINDINGS: There is no evidence of displaced fracture. There is circumferential
cortical thickening of the mid fibula. Soft tissues are normal.
IMPRESSION: Circumferential cortical thickening of the mid fibular shaft, which
may represent periosteal reaction such as seen in
infectious/inflammatory conditions, or be a sequela of repeated
microfractures, such as seen in stress fractures.

No evidence of displaced fractures.

## 2018-02-03 ENCOUNTER — Encounter: Payer: Self-pay | Admitting: Emergency Medicine

## 2018-02-03 ENCOUNTER — Ambulatory Visit
Admission: EM | Admit: 2018-02-03 | Discharge: 2018-02-03 | Disposition: A | Discharge status: Home / Self Care | Payer: BLUE CROSS/BLUE SHIELD | Attending: Family Medicine | Admitting: Family Medicine

## 2018-02-03 ENCOUNTER — Other Ambulatory Visit: Payer: Self-pay

## 2018-02-03 DIAGNOSIS — J069 Acute upper respiratory infection, unspecified: Secondary | ICD-10-CM | POA: Diagnosis not present

## 2018-02-03 DIAGNOSIS — R05 Cough: Secondary | ICD-10-CM | POA: Diagnosis not present

## 2018-02-03 DIAGNOSIS — R509 Fever, unspecified: Secondary | ICD-10-CM | POA: Diagnosis not present

## 2018-02-03 DIAGNOSIS — J029 Acute pharyngitis, unspecified: Secondary | ICD-10-CM | POA: Diagnosis not present

## 2018-02-03 LAB — RAPID STREP SCREEN (MED CTR MEBANE ONLY): STREPTOCOCCUS, GROUP A SCREEN (DIRECT): NEGATIVE

## 2018-02-03 MED ORDER — BENZONATATE 200 MG PO CAPS: ORAL_CAPSULE | ORAL | 0 refills | Status: DC

## 2018-02-03 MED ORDER — ALBUTEROL SULFATE HFA 108 (90 BASE) MCG/ACT IN AERS: 1.0000 | INHALATION_SPRAY | Freq: Four times a day (QID) | RESPIRATORY_TRACT | 0 refills | Status: DC | PRN

## 2018-02-03 NOTE — ED Provider Notes (Signed)
[facility]    CSN: [id number] Arrival date & time: 02/03/18  1639     History   Chief Complaint Chief Complaint  Patient presents with  . Fever    HPI Cindy Edwards is a 42 y.o. female.   HPI  42 year old female presents with allergic symptoms with sinuses and nose draining postnasal drip and a cough.  Has had no fever although she has felt feverish.  She is afebrile today.  She has been taking Allegra and Zyrtec but has not noticed much improvement.  She does work outdoors and has noticed increase in pollen which made her think this was more of an allergic type of syndrome.          Past Medical History:  Diagnosis Date  . Raynaud's phenomenon     There are no active problems to display for this patient.   Past Surgical History:  Procedure Laterality Date  . WISDOM TOOTH EXTRACTION      OB History   None      Home Medications    Prior to Admission medications   Medication Sig Start Date End Date Taking? Authorizing Provider  norethindrone-ethinyl estradiol (JUNEL FE,GILDESS FE,LOESTRIN FE) 1-20 MG-MCG tablet Take 1 tablet by mouth daily.   Yes [provider]  albuterol (PROVENTIL HFA;VENTOLIN HFA) 108 (90 Base) MCG/ACT inhaler Inhale 1-2 puffs into the lungs every 6 (six) hours as needed for wheezing or shortness of breath. Use with spacer 02/03/18   Lutricia Feiloemer, Lillyahna Hemberger P, PA-C  benzonatate (TESSALON) 200 MG capsule Take one cap TID PRN cough 02/03/18   Lutricia Feiloemer, Jady Braggs P, PA-C  naproxen (NAPROSYN) 500 MG tablet Take 1 tablet (500 mg total) by mouth 2 (two) times daily. 07/24/17   Lutricia Feiloemer, Eaden Hettinger P, PA-C  naproxen (NAPROSYN) 500 MG tablet Take 1 tablet (500 mg total) 2 (two) times daily by mouth. 08/20/17   Candis SchatzHarris, Michael D, PA-C    Family History Family History  Problem Relation Age of Onset  . Hypertension Father   . Cancer Other     Social History Social History   Tobacco Use  . Smoking status: Never Smoker  . Smokeless  tobacco: Never Used  Substance Use Topics  . Alcohol use: Yes    Comment: daily 2 per day  . Drug use: No     Allergies   Patient has no known allergies.   Review of Systems Review of Systems  Constitutional: Positive for activity change. Negative for appetite change, chills, fatigue and fever.  HENT: Positive for congestion, postnasal drip, rhinorrhea, sinus pressure, sneezing and sore throat.   Respiratory: Positive for cough and wheezing.   All other systems reviewed and are negative.    Physical Exam Triage Vital Signs ED Triage Vitals  Enc Vitals Group     BP 02/03/18 1648 101/86     Pulse Rate 02/03/18 1648 94     Resp 02/03/18 1648 18     Temp 02/03/18 1648 98.5 F (36.9 C)     Temp src --      SpO2 02/03/18 1648 100 %     Weight 02/03/18 1648 160 lb (72.6 kg)     Height 02/03/18 1648 5\' 5"  (1.651 m)     Head Circumference --      Peak Flow --      Pain Score 02/03/18 1647 5     Pain Loc --      Pain Edu? --      Excl. in  GC? --    No data found.  Updated Vital Signs BP 101/86 (BP Location: Left Arm)   Pulse 94   Temp 98.5 F (36.9 C)   Resp 18   Ht 5\' 5"  (1.651 m)   Wt 160 lb (72.6 kg)   LMP 01/10/2018   SpO2 100%   BMI 26.63 kg/m   Visual Acuity Right Eye Distance:   Left Eye Distance:   Bilateral Distance:    Right Eye Near:   Left Eye Near:    Bilateral Near:     Physical Exam  Constitutional: She is oriented to person, place, and time. She appears well-developed and well-nourished. No distress.  HENT:  Head: Normocephalic.  Right Ear: External ear normal.  Left Ear: External ear normal.  Nose: Nose normal.  Mouth/Throat: Oropharynx is clear and moist. No oropharyngeal exudate.  Eyes: Pupils are equal, round, and reactive to light. Right eye exhibits no discharge. Left eye exhibits no discharge.  Neck: Normal range of motion.  Pulmonary/Chest: Effort normal. She has wheezes.  Musculoskeletal: Normal range of motion.    Lymphadenopathy:    She has no cervical adenopathy.  Neurological: She is alert and oriented to person, place, and time.  Skin: Skin is warm and dry. She is not diaphoretic.  Psychiatric: She has a normal mood and affect. Her behavior is normal. Judgment and thought content normal.  Nursing note and vitals reviewed.    UC Treatments / Results  Labs (all labs ordered are listed, but only abnormal results are displayed) Labs Reviewed  RAPID STREP SCREEN ([facility] ONLY)  CULTURE, GROUP A STREP [facility])    EKG None Radiology No results found.  Procedures Procedures (including critical care time)  Medications Ordered in UC Medications - No data to display   Initial Impression / Assessment and Plan / UC Course  I have reviewed the triage vital signs and the nursing notes.  Pertinent labs & imaging results that were available during my care of the patient were reviewed by me and considered in my medical decision making (see chart for details).     Plan: 1. Test/x-ray results and diagnosis reviewed with patient 2. rx as per orders; risks, benefits, potential side effects reviewed with patient 3. Recommend supportive treatment with Flonase with the Zyrtec or Allegra.  Advised to use Tessalon Perles for coughing.  Was given  a prescription for albuterol inhaler for wheezing  if she is having shortness of breath and wheezing.  She is not improving or worsens  she should follow-up with her primary care physician. 4. F/u prn if symptoms worsen or don't improve   Final Clinical Impressions(s) / UC Diagnoses   Final diagnoses:  Upper respiratory tract infection, unspecified type    ED Discharge Orders        Ordered    benzonatate (TESSALON) 200 MG capsule     02/03/18 1721    albuterol (PROVENTIL HFA;VENTOLIN HFA) 108 (90 Base) MCG/ACT inhaler  Every 6 hours PRN    Note to Pharmacy:  Provide spacer and instructions to patient   02/03/18 1724       Controlled Substance  Prescriptions [place] Controlled Substance Registry consulted? Not Applicable   Lutricia Feil, PA-C 02/03/18 1737

## 2018-02-03 NOTE — Discharge Instructions (Signed)
Use Flonase daily also Zyrtec Claritin or Allegra daily throughout the spring pollen season.  His cough pills for any coughing.  Use the albuterol inhaler as necessary for wheezing

## 2018-02-03 NOTE — ED Triage Notes (Signed)
Patient states she developed what she thought was allergies yesterday and today developed a fever and aches

## 2018-02-06 LAB — CULTURE, GROUP A STREP (THRC)

## 2018-08-07 ENCOUNTER — Encounter: Payer: Self-pay | Admitting: Emergency Medicine

## 2018-08-07 ENCOUNTER — Other Ambulatory Visit: Payer: Self-pay

## 2018-08-07 ENCOUNTER — Ambulatory Visit
Admission: EM | Admit: 2018-08-07 | Discharge: 2018-08-07 | Disposition: A | Discharge status: Home / Self Care | Payer: BLUE CROSS/BLUE SHIELD | Attending: Family Medicine | Admitting: Family Medicine

## 2018-08-07 DIAGNOSIS — M940 Chondrocostal junction syndrome [Tietze]: Secondary | ICD-10-CM

## 2018-08-07 DIAGNOSIS — J069 Acute upper respiratory infection, unspecified: Secondary | ICD-10-CM | POA: Diagnosis not present

## 2018-08-07 DIAGNOSIS — B9789 Other viral agents as the cause of diseases classified elsewhere: Secondary | ICD-10-CM | POA: Diagnosis not present

## 2018-08-07 LAB — RAPID STREP SCREEN (MED CTR MEBANE ONLY): Streptococcus, Group A Screen (Direct): NEGATIVE

## 2018-08-07 MED ORDER — HYDROCOD POLST-CPM POLST ER 10-8 MG/5ML PO SUER: 5.0000 mL | Freq: Every evening | ORAL | 0 refills | Status: DC | PRN

## 2018-08-07 NOTE — ED Triage Notes (Signed)
Pt c/o sore throat, sinus pressure, sinus pain, cough, fatigue, chest pain on her right side when she takes a deep breath. Started about 4 days ago.

## 2018-08-07 NOTE — ED Provider Notes (Signed)
[facility]    CSN: [id number] Arrival date & time: 08/07/18  1259     History   Chief Complaint Chief Complaint  Patient presents with  . Sore Throat  . Cough    HPI Cindy Edwards is a 42 y.o. female.   42 yo female with a c/o sore throat, sinus congestion, cough and sharp chest pains with cough or deep breathing. Denies any fevers, chills, sputum production.   The history is provided by the patient.  Sore Throat   Cough  Associated symptoms: ear pain and rhinorrhea   URI  Presenting symptoms: cough, ear pain and rhinorrhea   Severity:  Moderate Onset quality:  Sudden Timing:  Constant Progression:  Worsening Chronicity:  New   Past Medical History:  Diagnosis Date  . Raynaud's phenomenon     There are no active problems to display for this patient.   Past Surgical History:  Procedure Laterality Date  . WISDOM TOOTH EXTRACTION      OB History   None      Home Medications    Prior to Admission medications   Medication Sig Start Date End Date Taking? Authorizing Provider  albuterol (PROVENTIL HFA;VENTOLIN HFA) 108 (90 Base) MCG/ACT inhaler Inhale 1-2 puffs into the lungs every 6 (six) hours as needed for wheezing or shortness of breath. Use with spacer 02/03/18  Yes Lutricia Feil, PA-C  naproxen (NAPROSYN) 500 MG tablet Take 1 tablet (500 mg total) by mouth 2 (two) times daily. 07/24/17  Yes Lutricia Feil, PA-C  norethindrone-ethinyl estradiol (JUNEL FE,GILDESS FE,LOESTRIN FE) 1-20 MG-MCG tablet Take 1 tablet by mouth daily.   Yes [provider]  benzonatate (TESSALON) 200 MG capsule Take one cap TID PRN cough 02/03/18   Lutricia Feil, PA-C  chlorpheniramine-HYDROcodone [facility]) 10-8 MG/5ML SUER Take 5 mLs by mouth at bedtime as needed. 08/07/18   Payton Mccallum, MD  naproxen (NAPROSYN) 500 MG tablet Take 1 tablet (500 mg total) 2 (two) times daily by mouth. 08/20/17   Candis Schatz, PA-C     Family History Family History  Problem Relation Age of Onset  . Hypertension Father   . Cancer Other     Social History Social History   Tobacco Use  . Smoking status: Never Smoker  . Smokeless tobacco: Never Used  Substance Use Topics  . Alcohol use: Yes    Comment: daily 2 per day  . Drug use: No     Allergies   Patient has no known allergies.   Review of Systems Review of Systems  HENT: Positive for ear pain and rhinorrhea.   Respiratory: Positive for cough.      Physical Exam Triage Vital Signs ED Triage Vitals  Enc Vitals Group     BP 08/07/18 1312 116/81     Pulse Rate 08/07/18 1312 77     Resp 08/07/18 1312 17     Temp 08/07/18 1312 98.6 F (37 C)     Temp Source 08/07/18 1312 Oral     SpO2 08/07/18 1312 100 %     Weight 08/07/18 1310 170 lb (77.1 kg)     Height 08/07/18 1310 5' 4.75" (1.645 m)     Head Circumference --      Peak Flow --      Pain Score 08/07/18 1310 7     Pain Loc --      Pain Edu? --      Excl. in GC? --  No data found.  Updated Vital Signs BP 116/81 (BP Location: Left Arm)   Pulse 77   Temp 98.6 F (37 C) (Oral)   Resp 17   Ht 5' 4.75" (1.645 m)   Wt 77.1 kg   LMP 07/25/2018 (Approximate)   SpO2 100%   BMI 28.51 kg/m   Visual Acuity Right Eye Distance:   Left Eye Distance:   Bilateral Distance:    Right Eye Near:   Left Eye Near:    Bilateral Near:     Physical Exam  Constitutional: She appears well-developed and well-nourished. No distress.  HENT:  Head: Normocephalic and atraumatic.  Right Ear: Tympanic membrane, external ear and ear canal normal.  Left Ear: Tympanic membrane, external ear and ear canal normal.  Nose: Mucosal edema and rhinorrhea present. No nose lacerations, sinus tenderness, nasal deformity, septal deviation or nasal septal hematoma. No epistaxis.  No foreign bodies.  Mouth/Throat: Uvula is midline and mucous membranes are normal. Posterior oropharyngeal erythema present. No  oropharyngeal exudate, posterior oropharyngeal edema or tonsillar abscesses. No tonsillar exudate.  Eyes: Conjunctivae are normal. Right eye exhibits no discharge. Left eye exhibits no discharge. No scleral icterus.  Neck: Normal range of motion. Neck supple. No thyromegaly present.  Cardiovascular: Normal rate, regular rhythm and normal heart sounds.  Pulmonary/Chest: Effort normal and breath sounds normal. No stridor. No respiratory distress. She has no wheezes. She has no rales. She exhibits tenderness (right lateral posterior chest wall).  Lymphadenopathy:    She has no cervical adenopathy.  Skin: She is not diaphoretic.  Nursing note and vitals reviewed.    UC Treatments / Results  Labs (all labs ordered are listed, but only abnormal results are displayed) Labs Reviewed  RAPID STREP SCREEN ([facility] ONLY)  CULTURE, GROUP A STREP [facility])    EKG None  Radiology No results found.  Procedures Procedures (including critical care time)  Medications Ordered in UC Medications - No data to display  Initial Impression / Assessment and Plan / UC Course  I have reviewed the triage vital signs and the nursing notes.  Pertinent labs & imaging results that were available during my care of the patient were reviewed by me and considered in my medical decision making (see chart for details).      Final Clinical Impressions(s) / UC Diagnoses   Final diagnoses:  Viral URI with cough  Costochondritis     Discharge Instructions     Ice and naproxen  Rest, fluids    ED Prescriptions    Medication Sig Dispense Auth. Provider   chlorpheniramine-HYDROcodone (TUSSIONEX PENNKINETIC ER) 10-8 MG/5ML SUER Take 5 mLs by mouth at bedtime as needed. 60 mL Payton Mccallum, MD     1. Lab results and diagnosis reviewed with patient 2. rx as per orders above; reviewed possible side effects, interactions, risks and benefits  3. Recommend supportive treatment as above   4. Follow-up  prn if symptoms worsen or don't improve   Controlled Substance Prescriptions [place] Controlled Substance Registry consulted? Not Applicable   Payton Mccallum, MD 08/07/18 1447

## 2018-08-07 NOTE — Discharge Instructions (Signed)
Ice and naproxen  Rest, fluids

## 2018-08-10 LAB — CULTURE, GROUP A STREP (THRC)

## 2018-08-11 ENCOUNTER — Telehealth (HOSPITAL_COMMUNITY): Payer: Self-pay

## 2018-08-11 MED ORDER — PENICILLIN V POTASSIUM 500 MG PO TABS: 500.0000 mg | ORAL_TABLET | Freq: Two times a day (BID) | ORAL | 0 refills | Status: AC

## 2018-08-11 NOTE — Telephone Encounter (Signed)
Culture is positive for non group A Strep germ.  This is a finding of uncertain significance; not the typical 'strep throat' germ.  Pt reports feeling better. 

## 2018-08-11 NOTE — Telephone Encounter (Signed)
Culture is positive for non group A Strep germ.  This is a finding of uncertain significance; not the typical 'strep throat' germ.  Pt complains of persistent symptoms.  Rx for penicillin V 500mg bid x 10d #20 no refills sent to pharmacy of patients choice. Pt verbalized understanding. Recheck for further evaluation if symptoms are not improving. 

## 2018-09-12 ENCOUNTER — Ambulatory Visit
Admission: EM | Admit: 2018-09-12 | Discharge: 2018-09-12 | Disposition: A | Discharge status: Home / Self Care | Payer: BLUE CROSS/BLUE SHIELD | Attending: Family Medicine | Admitting: Family Medicine

## 2018-09-12 ENCOUNTER — Other Ambulatory Visit: Payer: Self-pay

## 2018-09-12 ENCOUNTER — Encounter: Payer: Self-pay | Admitting: Emergency Medicine

## 2018-09-12 DIAGNOSIS — S39012A Strain of muscle, fascia and tendon of lower back, initial encounter: Secondary | ICD-10-CM

## 2018-09-12 DIAGNOSIS — X58XXXA Exposure to other specified factors, initial encounter: Secondary | ICD-10-CM

## 2018-09-12 MED ORDER — METAXALONE 800 MG PO TABS: 800.0000 mg | ORAL_TABLET | Freq: Three times a day (TID) | ORAL | 0 refills | Status: DC

## 2018-09-12 MED ORDER — KETOROLAC TROMETHAMINE 60 MG/2ML IM SOLN
60.0000 mg | Freq: Once | INTRAMUSCULAR | Status: AC
  Administered 2018-09-12: 60 mg via INTRAMUSCULAR

## 2018-09-12 MED ORDER — LIDOCAINE 5 % EX PTCH: 1.0000 | MEDICATED_PATCH | CUTANEOUS | 0 refills | Status: DC

## 2018-09-12 MED ORDER — MELOXICAM 15 MG PO TABS: 15.0000 mg | ORAL_TABLET | Freq: Every day | ORAL | 0 refills | Status: DC

## 2018-09-12 NOTE — ED Provider Notes (Signed)
[facility]    CSN: [id number] Arrival date & time: 09/12/18  0827     History   Chief Complaint Chief Complaint  Patient presents with  . Back Pain    HPI Cindy Edwards is a 42 y.o. female.   HPI  42 year old female presents with nonradiating low back pain indicating the sacroiliac region happened last night.  Was participating in a cardiovascular exercise program including boxing but while doing [employer] felt pain in her low back.  Since that time any attempt at sitting or twisting such as turning over in bed has been very excruciating.  She has no radicular symptoms into her buttocks or legs.  She has no incontinence.  States it was very painful to try to sit and urinate this morning.         Past Medical History:  Diagnosis Date  . Raynaud's phenomenon     There are no active problems to display for this patient.   Past Surgical History:  Procedure Laterality Date  . WISDOM TOOTH EXTRACTION      OB History   None      Home Medications    Prior to Admission medications   Medication Sig Start Date End Date Taking? Authorizing Provider  norethindrone-ethinyl estradiol (JUNEL FE,GILDESS FE,LOESTRIN FE) 1-20 MG-MCG tablet Take 1 tablet by mouth daily.   Yes [provider]  albuterol (PROVENTIL HFA;VENTOLIN HFA) 108 (90 Base) MCG/ACT inhaler Inhale 1-2 puffs into the lungs every 6 (six) hours as needed for wheezing or shortness of breath. Use with spacer 02/03/18   Lutricia Feiloemer, Grayden Burley P, PA-C  lidocaine (LIDODERM) 5 % Place 1 patch onto the skin daily. Remove & Discard patch within 12 hours or as directed by MD 09/12/18   Lutricia Feiloemer, Evanny Ellerbe P, PA-C  meloxicam (MOBIC) 15 MG tablet Take 1 tablet (15 mg total) by mouth daily. 09/12/18   Lutricia Feiloemer, Willia Lampert P, PA-C  metaxalone (SKELAXIN) 800 MG tablet Take 1 tablet (800 mg total) by mouth 3 (three) times daily. 09/12/18   Lutricia Feiloemer, Markeshia Giebel P, PA-C  naproxen (NAPROSYN) 500 MG tablet Take 1 tablet (500 mg  total) by mouth 2 (two) times daily. 07/24/17   Lutricia Feiloemer, Garet Hooton P, PA-C  naproxen (NAPROSYN) 500 MG tablet Take 1 tablet (500 mg total) 2 (two) times daily by mouth. 08/20/17   Candis SchatzHarris, Michael D, PA-C    Family History Family History  Problem Relation Age of Onset  . Hypertension Father   . Cancer Other     Social History Social History   Tobacco Use  . Smoking status: Never Smoker  . Smokeless tobacco: Never Used  Substance Use Topics  . Alcohol use: Yes    Comment: daily 2 per day  . Drug use: No     Allergies   Patient has no known allergies.   Review of Systems Review of Systems  Constitutional: Positive for activity change. Negative for chills and fever.  Musculoskeletal: Positive for back pain and myalgias.  All other systems reviewed and are negative.    Physical Exam Triage Vital Signs ED Triage Vitals  Enc Vitals Group     BP 09/12/18 0836 121/77     Pulse Rate 09/12/18 0836 92     Resp 09/12/18 0836 14     Temp 09/12/18 0836 98.1 F (36.7 C)     Temp Source 09/12/18 0836 Oral     SpO2 09/12/18 0836 91 %     Weight 09/12/18 0834 170 lb (77.1 kg)  Height 09/12/18 0834 5\' 5"  (1.651 m)     Head Circumference --      Peak Flow --      Pain Score 09/12/18 0834 6     Pain Loc --      Pain Edu? --      Excl. in GC? --    No data found.  Updated Vital Signs BP 121/77 (BP Location: Right Arm)   Pulse 92   Temp 98.1 F (36.7 C) (Oral)   Resp 14   Ht 5\' 5"  (1.651 m)   Wt 170 lb (77.1 kg)   LMP 08/29/2018 (Approximate)   SpO2 91%   BMI 28.29 kg/m   Visual Acuity Right Eye Distance:   Left Eye Distance:   Bilateral Distance:    Right Eye Near:   Left Eye Near:    Bilateral Near:     Physical Exam  Constitutional: She is oriented to person, place, and time. She appears well-developed and well-nourished. No distress.  HENT:  Head: Normocephalic.  Eyes: Pupils are equal, round, and reactive to light. Right eye exhibits no discharge. Left  eye exhibits no discharge.  Neck: Normal range of motion.  Musculoskeletal: She exhibits tenderness.  Examination of the lumbar spine was performed with Herbert Seta, Geologist, engineering.  Patient has mild flattening of the lordotic curve.  To forward flex to approximately 30degrees with the onset of the discomfort in the lower lumbar segments.  Returning to upright posture is painful.  She has good lateral flexion bilaterally without discomfort.  She has good thoracic rotation without discomfort.  She is able to toe and heel walk normally.  Sensation is intact to light touch throughout the lower extremities.  EHL peroneal and anterior tibialis muscles are strong to clinical testing.  Leg raise testing is negative at 90 degrees bilaterally in the seated position.  Tender mostly over the left sacroiliac joint but also in the paraspinous muscles of the lower lumbar segment.  Neurological: She is alert and oriented to person, place, and time.  Skin: Skin is warm and dry. She is not diaphoretic.  Psychiatric: She has a normal mood and affect. Her behavior is normal. Judgment and thought content normal.  Nursing note and vitals reviewed.    UC Treatments / Results  Labs (all labs ordered are listed, but only abnormal results are displayed) Labs Reviewed - No data to display  EKG None  Radiology No results found.  Procedures Procedures (including critical care time)  Medications Ordered in UC Medications  ketorolac (TORADOL) injection 60 mg (60 mg Intramuscular Given 09/12/18 0916)    Initial Impression / Assessment and Plan / UC Course  I have reviewed the triage vital signs and the nursing notes.  Pertinent labs & imaging results that were available during my care of the patient were reviewed by me and considered in my medical decision making (see chart for details).   Patient should avoid symptoms as much as possible and rest.  Limit sitting lifting or bending.  Recommended she not  return to her physical activity for 2 weeks and when she returned she should start slow and gradually improved as tolerated.  He develops any incontinence or any radicular symptoms she should go to the emergency room.  See discharge instructions below   Final Clinical Impressions(s) / UC Diagnoses   Final diagnoses:  Strain of lumbar region, initial encounter     Discharge Instructions     Apply ice 20 minutes out of  every 2 hours 4-5 times daily for comfort.  Use  Caution while taking muscle relaxers.  Do not perform activities requiring concentration or judgment and do not drive.     ED Prescriptions    Medication Sig Dispense Auth. Provider   meloxicam (MOBIC) 15 MG tablet Take 1 tablet (15 mg total) by mouth daily. 30 tablet Lutricia Feil, PA-C   metaxalone (SKELAXIN) 800 MG tablet Take 1 tablet (800 mg total) by mouth 3 (three) times daily. 21 tablet Ovid Curd P, PA-C   lidocaine (LIDODERM) 5 % Place 1 patch onto the skin daily. Remove & Discard patch within 12 hours or as directed by MD 30 patch Lutricia Feil, PA-C     Controlled Substance Prescriptions  Controlled Substance Registry consulted? Not Applicable   Lutricia Feil, PA-C 09/12/18 1610

## 2018-09-12 NOTE — Discharge Instructions (Signed)
Apply ice 20 minutes out of every 2 hours 4-5 times daily for comfort. Use  Caution while taking muscle relaxers.  Do not perform activities requiring concentration or judgment and do not drive.  °

## 2018-09-12 NOTE — ED Triage Notes (Signed)
Patient states that she was boxing and exercising last night and felt pain in her lower back.

## 2018-10-07 ENCOUNTER — Encounter: Payer: Self-pay | Admitting: Emergency Medicine

## 2018-10-07 ENCOUNTER — Ambulatory Visit
Admission: EM | Admit: 2018-10-07 | Discharge: 2018-10-07 | Disposition: A | Discharge status: Home / Self Care | Payer: BLUE CROSS/BLUE SHIELD | Attending: Family Medicine | Admitting: Family Medicine

## 2018-10-07 ENCOUNTER — Other Ambulatory Visit: Payer: Self-pay

## 2018-10-07 DIAGNOSIS — J069 Acute upper respiratory infection, unspecified: Secondary | ICD-10-CM | POA: Diagnosis not present

## 2018-10-07 NOTE — Discharge Instructions (Addendum)
Over the counter medication as discussed. Rest. Drink plenty of fluids.  ° °Follow up with your primary care physician this week as needed. Return to Urgent care for new or worsening concerns.  ° °

## 2018-10-07 NOTE — ED Provider Notes (Signed)
[facility] ____________________________________________  Time seen: Approximately 1:22 PM  I have reviewed the triage vital signs and the nursing notes.   HISTORY  Chief Complaint Cough and Headache   HPI Cindy Edwards is a 42 y.o. female presenting for evaluation of runny nose, nasal congestion, cough present for the last 4 days.  States feels like she has congestion in her head as well as her ears.  States has had intermittent lightheadedness particularly with movements.  No lasting lightheadedness.  Denies any unilateral weakness, vision changes or unsteady gait.  Felt flush and warm, denies fevers.  Has taken over-the-counter DayQuil and ibuprofen which helps some.  States that she felt like she was having a viral cold, but reports today she was having issues with her work as they were asking her to come in, and she felt more flushed.  Occasional sore throat.  Continues overall eat and drink well.  Denies known direct sick contacts.  Denies other relieving factors.  Denies chest pain or shortness of breath.  Claretta FraiseSkariah, Anita, DO: PCP    Past Medical History:  Diagnosis Date  . Raynaud's phenomenon     There are no active problems to display for this patient.   Past Surgical History:  Procedure Laterality Date  . WISDOM TOOTH EXTRACTION       No current facility-administered medications for this encounter.   Current Outpatient Medications:  .  albuterol (PROVENTIL HFA;VENTOLIN HFA) 108 (90 Base) MCG/ACT inhaler, Inhale 1-2 puffs into the lungs every 6 (six) hours as needed for wheezing or shortness of breath. Use with spacer, Disp: 1 Inhaler, Rfl: 0 .  norethindrone-ethinyl estradiol (JUNEL FE,GILDESS FE,LOESTRIN FE) 1-20 MG-MCG tablet, Take 1 tablet by mouth daily., Disp: , Rfl:   Allergies Patient has no known allergies.  Family History  Problem Relation Age of Onset  . Hypertension Father   . Cancer Other     Social History Social History    Tobacco Use  . Smoking status: Never Smoker  . Smokeless tobacco: Never Used  Substance Use Topics  . Alcohol use: Yes    Comment: daily 2 per day  . Drug use: No    Review of Systems Constitutional: No fever Eyes: No visual changes. ENT: No sore throat. Cardiovascular: Denies chest pain. Respiratory: Denies shortness of breath. Gastrointestinal: No abdominal pain.  No nausea, no vomiting.  Genitourinary: Negative for dysuria. Musculoskeletal: Negative for back pain. Skin: Negative for rash. Neurological: Negative for headaches, focal weakness or numbness.   ____________________________________________   PHYSICAL EXAM:  VITAL SIGNS: ED Triage Vitals  Enc Vitals Group     BP 10/07/18 1234 123/68     Pulse Rate 10/07/18 1234 83     Resp 10/07/18 1234 18     Temp 10/07/18 1234 98.1 F (36.7 C)     Temp Source 10/07/18 1234 Oral     SpO2 10/07/18 1234 100 %     Weight 10/07/18 1232 170 lb (77.1 kg)     Height 10/07/18 1232 5\' 5"  (1.651 m)     Head Circumference --      Peak Flow --      Pain Score 10/07/18 1232 4     Pain Loc --      Pain Edu? --      Excl. in GC? --     Constitutional: Alert and oriented. Well appearing and in no acute distress. Eyes: Conjunctivae are normal. Head: Atraumatic. No sinus tenderness to palpation. No swelling. No  erythema.  Ears: no erythema, normal TMs bilaterally.   Nose:Nasal congestion  Mouth/Throat: Mucous membranes are moist. Mild pharyngeal erythema. No tonsillar swelling or exudate.  Neck: No stridor.  No cervical spine tenderness to palpation. Hematological/Lymphatic/Immunilogical: No cervical lymphadenopathy. Cardiovascular: Normal rate, regular rhythm. Grossly normal heart sounds.  Good peripheral circulation. Respiratory: Normal respiratory effort.  No retractions. No wheezes, rales or rhonchi. Good air movement.  Musculoskeletal: Ambulatory with steady gait. No cervical, thoracic or lumbar tenderness to  palpation. Neurologic:  Normal speech and language. No gait instability.  No focal neurological deficits.  No paresthesias.5/5 strength to bilateral upper and lower extremities. Skin:  Skin appears warm, dry and intact. No rash noted. Psychiatric: Mood and affect are normal. Speech and behavior are normal.  ___________________________________________   LABS (all labs ordered are listed, but only abnormal results are displayed)  Labs Reviewed - No data to display  PROCEDURES Procedures    INITIAL IMPRESSION / ASSESSMENT AND PLAN / ED COURSE  Pertinent labs & imaging results that were available during my care of the patient were reviewed by me and considered in my medical decision making (see chart for details).  Well-appearing patient.  No acute distress.  Suspect viral upper respiratory infection.  Encourage rest, fluids, over-the-counter cough and congestion medications as needed.  Work note given for today and tomorrow.  Discussed follow up with Primary care physician this week. Discussed follow up and return parameters including no resolution or any worsening concerns. Patient verbalized understanding and agreed to plan.   ____________________________________________   FINAL CLINICAL IMPRESSION(S) / ED DIAGNOSES  Final diagnoses:  Upper respiratory tract infection, unspecified type     ED Discharge Orders    None       Note: This dictation was prepared with Dragon dictation along with smaller phrase technology. Any transcriptional errors that result from this process are unintentional.         Renford DillsMiller, Josemaria Brining, NP 10/07/18 1458

## 2018-10-07 NOTE — ED Triage Notes (Signed)
Patient c/o cough, headache, nasal congestion that started 4 days ago. Patient states she has been Dayquil and Ibuprofen for her symptoms.

## 2019-06-05 ENCOUNTER — Other Ambulatory Visit: Payer: Self-pay

## 2019-06-05 ENCOUNTER — Ambulatory Visit
Admission: EM | Admit: 2019-06-05 | Discharge: 2019-06-05 | Disposition: A | Discharge status: Home / Self Care | Payer: BLUE CROSS/BLUE SHIELD | Attending: Family Medicine | Admitting: Family Medicine

## 2019-06-05 ENCOUNTER — Encounter: Payer: Self-pay | Admitting: Emergency Medicine

## 2019-06-05 DIAGNOSIS — R51 Headache: Secondary | ICD-10-CM

## 2019-06-05 DIAGNOSIS — R519 Headache, unspecified: Secondary | ICD-10-CM

## 2019-06-05 DIAGNOSIS — L255 Unspecified contact dermatitis due to plants, except food: Secondary | ICD-10-CM

## 2019-06-05 MED ORDER — CLOBETASOL PROPIONATE 0.05 % EX CREA: 1.0000 "application " | TOPICAL_CREAM | Freq: Two times a day (BID) | CUTANEOUS | 0 refills | Status: AC

## 2019-06-05 MED ORDER — NAPROXEN 500 MG PO TABS: 500.0000 mg | ORAL_TABLET | Freq: Two times a day (BID) | ORAL | 0 refills | Status: AC | PRN

## 2019-06-05 NOTE — Discharge Instructions (Signed)
Medications as directed.  Take care  Dr. Tangie Stay  

## 2019-06-05 NOTE — ED Triage Notes (Addendum)
Patient states she was hiking on Friday and slipped and fell hitting her head on the trail. Denies LOC. Denies sensitivity to light. Patent states she had a headache on Friday that resolved and now her headache returned starting yesterday afternoon. She also reports feeling tired since the fall. She is c/o poison ivy on her left hand and arm and would like a refill on her Triamcinolone cream.

## 2019-06-05 NOTE — ED Provider Notes (Signed)
[facility]    CSN: [id number] Arrival date & time: 06/05/19  1519  History   Chief Complaint Chief Complaint  Patient presents with  . Headache   HPI  43 year old female presents with headache.  Patient states that she was hiking on Friday.  She slipped and fell and landed in the movement.  States that she hit her head on the ground.  Denies loss of consciousness.  Denies nausea or vomiting.  Denies photophobia.  Patient reports that she has had back pain and neck pain since that time.  She had a headache initially and this improved but has returned again yesterday afternoon.  Pain is currently 7/10 in severity.  She has taken Advil without improvement.  No known exacerbating factors.  No other associated symptoms.  Additionally, patient states that she has poison ivy on her left wrist.  She states that she is almost out of her steroid cream and is requesting a refill today.  No other complaints at this time.  PMH, Surgical Hx, Family Hx, Social History reviewed and updated as below.  Past Medical History:  Diagnosis Date  . Raynaud's phenomenon    Past Surgical History:  Procedure Laterality Date  . WISDOM TOOTH EXTRACTION     OB History   No obstetric history on file.    Home Medications    Prior to Admission medications   Medication Sig Start Date End Date Taking? Authorizing Provider  clobetasol cream (TEMOVATE) 3.87 % Apply 1 application topically 2 (two) times daily. 06/05/19   Coral Spikes, DO  naproxen (NAPROSYN) 500 MG tablet Take 1 tablet (500 mg total) by mouth 2 (two) times daily as needed for headache. 06/05/19   Coral Spikes, DO  albuterol (PROVENTIL HFA;VENTOLIN HFA) 108 (90 Base) MCG/ACT inhaler Inhale 1-2 puffs into the lungs every 6 (six) hours as needed for wheezing or shortness of breath. Use with spacer 02/03/18 06/05/19  Lorin Picket, PA-C  norethindrone-ethinyl estradiol (JUNEL FE,GILDESS FE,LOESTRIN FE) 1-20 MG-MCG tablet Take 1 tablet  by mouth daily.  06/05/19  [provider]    Family History Family History  Problem Relation Age of Onset  . Hypertension Father   . Cancer Other     Social History Social History   Tobacco Use  . Smoking status: Never Smoker  . Smokeless tobacco: Never Used  Substance Use Topics  . Alcohol use: Yes    Comment: daily 2 per day  . Drug use: No     Allergies   Patient has no known allergies.   Review of Systems Review of Systems  Constitutional: Negative.   Gastrointestinal: Negative.   Skin: Positive for rash.  Neurological: Positive for headaches. Negative for syncope.  All other systems reviewed and are negative.  Physical Exam Triage Vital Signs ED Triage Vitals [06/05/19 1537]  Enc Vitals Group     BP 114/78     Pulse Rate 89     Resp 18     Temp 98.5 F (36.9 C)     Temp Source Oral     SpO2 100 %     Weight 180 lb (81.6 kg)     Height 5\' 5"  (1.651 m)     Head Circumference      Peak Flow      Pain Score 7     Pain Loc      Pain Edu?      Excl. in [place]?    Updated Vital Signs BP  114/78 (BP Location: Right Arm)   Pulse 89   Temp 98.5 F (36.9 C) (Oral)   Resp 18   Ht 5\' 5"  (1.651 m)   Wt 81.6 kg   LMP 05/29/2019   SpO2 100%   BMI 29.95 kg/m   Visual Acuity Right Eye Distance:   Left Eye Distance:   Bilateral Distance:    Right Eye Near:   Left Eye Near:    Bilateral Near:     Physical Exam Vitals signs and nursing note reviewed.  Constitutional:      General: She is not in acute distress.    Appearance: Normal appearance. She is not ill-appearing.  HENT:     Head: Normocephalic and atraumatic.  Eyes:     General:        Right eye: No discharge.        Left eye: No discharge.     Extraocular Movements: Extraocular movements intact.     Conjunctiva/sclera: Conjunctivae normal.     Pupils: Pupils are equal, round, and reactive to light.  Cardiovascular:     Rate and Rhythm: Normal rate and regular rhythm.     Heart  sounds: No murmur.  Pulmonary:     Effort: Pulmonary effort is normal.     Breath sounds: Normal breath sounds. No wheezing, rhonchi or rales.  Neurological:     General: No focal deficit present.     Mental Status: She is alert and oriented to person, place, and time.     Cranial Nerves: No cranial nerve deficit.     Sensory: No sensory deficit.     Motor: No weakness.  Psychiatric:        Mood and Affect: Mood normal.        Behavior: Behavior normal.    UC Treatments / Results  Labs (all labs ordered are listed, but only abnormal results are displayed) Labs Reviewed - No data to display  EKG   Radiology No results found.  Procedures Procedures (including critical care time)  Medications Ordered in UC Medications - No data to display  Initial Impression / Assessment and Plan / UC Course  I have reviewed the triage vital signs and the nursing notes.  Pertinent labs & imaging results that were available during my care of the patient were reviewed by me and considered in my medical decision making (see chart for details).    43 year old female presents with headache.  Neurological exam normal.  No indication for imaging.  Naproxen as directed for headache.  Regarding her poison ivy, I have prescribed clobetasol cream.  Final Clinical Impressions(s) / UC Diagnoses   Final diagnoses:  Acute nonintractable headache, unspecified headache type  Dermatitis due to plants, including poison ivy, sumac, and oak     Discharge Instructions     Medications as directed.  Take care  Dr. Adriana Simasook     ED Prescriptions    Medication Sig Dispense Auth. Provider   clobetasol cream (TEMOVATE) 0.05 % Apply 1 application topically 2 (two) times daily. 60 g Laurencia Roma G, DO   naproxen (NAPROSYN) 500 MG tablet Take 1 tablet (500 mg total) by mouth 2 (two) times daily as needed for headache. 30 tablet Tommie Samsook, Emmanuel Gruenhagen G, DO     Controlled Substance Prescriptions [place] Controlled Substance  Registry consulted? Not Applicable   Tommie SamsCook, Alfonso Shackett G, DO 06/05/19 1631

## 2022-10-27 ENCOUNTER — Ambulatory Visit: Payer: Self-pay | Admitting: Family Medicine

## 2022-10-27 ENCOUNTER — Encounter: Payer: Self-pay | Admitting: Family Medicine

## 2022-10-27 DIAGNOSIS — Z113 Encounter for screening for infections with a predominantly sexual mode of transmission: Secondary | ICD-10-CM

## 2022-10-27 LAB — HM HIV SCREENING LAB: HM HIV Screening: NEGATIVE

## 2022-10-27 LAB — WET PREP FOR TRICH, YEAST, CLUE
Trichomonas Exam: NEGATIVE
Yeast Exam: NEGATIVE

## 2022-10-27 NOTE — Progress Notes (Signed)
[facility] Department  STI clinic/screening visit [place] [address] [phone]  Subjective:  Brocha Gilliam is a 47 y.o. female being seen today for an STI screening visit. The patient reports they do have symptoms.  Patient reports that they do not desire a pregnancy in the next year.   They reported they are not interested in discussing contraception today.    Patient's last menstrual period was 10/06/2022.  Patient has the following medical conditions:  There are no problems to display for this patient.   Chief Complaint  Patient presents with   SEXUALLY TRANSMITTED DISEASE    STD screening- patient is complaining of vaginal redness, irritation and itching     HPI  Patient reports to clinic with c/o vaginal irritation, itching, discharge and burning with urination  Does the patient using douching products? No  Last HIV test per patient/review of record was No results found for: "HMHIVSCREEN" No results found for: "HIV" Patient reports last pap was November 2023  Screening for MPX risk: Does the patient have an unexplained rash? No Is the patient MSM? No Does the patient endorse multiple sex partners or anonymous sex partners? No Did the patient have close or sexual contact with a person diagnosed with MPX? No Has the patient traveled outside the [place] where MPX is endemic? No Is there a high clinical suspicion for MPX-- evidenced by one of the following No  -Unlikely to be chickenpox  -Lymphadenopathy  -Rash that present in same phase of evolution on any given body part See flowsheet for further details and programmatic requirements.   Immunization history:  Immunization History  Administered Date(s) Administered   Tdap 03/14/2017     The following portions of the patient's history were reviewed and updated as appropriate: allergies, current medications, past medical history, past social history, past surgical history and  problem list.  Objective:  There were no vitals filed for this visit.  Physical Exam Vitals and nursing note reviewed.  Constitutional:      Appearance: Normal appearance.  HENT:     Head: Normocephalic and atraumatic.     Mouth/Throat:     Mouth: Mucous membranes are moist.     Pharynx: Oropharynx is clear. No oropharyngeal exudate or posterior oropharyngeal erythema.  Pulmonary:     Effort: Pulmonary effort is normal.  Abdominal:     General: Abdomen is flat.     Palpations: There is no mass.     Tenderness: There is no abdominal tenderness. There is no rebound.  Genitourinary:    Exam position: Lithotomy position.     Pubic Area: No rash or pubic lice.      Tanner stage (genital): 5.     Labia:        Right: No rash or lesion.        Left: No rash or lesion.      Vagina: Lesions present. No vaginal discharge, erythema or bleeding.     Cervix: Lesion present. No cervical motion tenderness, discharge, friability or erythema.     Uterus: Normal.      Adnexa: Right adnexa normal and left adnexa normal.     Rectum: Normal.          Comments: pH = 4  Redness and excoriation on bilateral labia  Two small lesions on right inner vaginal canal as well as on cervix as indicated per diagram.  Skin tag near rectum Lymphadenopathy:     Head:  Right side of head: No preauricular or posterior auricular adenopathy.     Left side of head: No preauricular or posterior auricular adenopathy.     Cervical: No cervical adenopathy.     Upper Body:     Right upper body: No supraclavicular, axillary or epitrochlear adenopathy.     Left upper body: No supraclavicular, axillary or epitrochlear adenopathy.     Lower Body: No right inguinal adenopathy. No left inguinal adenopathy.  Skin:    General: Skin is warm and dry.     Findings: No rash.  Neurological:     Mental Status: She is alert and oriented to person, place, and time.    Assessment and Plan:  Khalilah Hoke is a  47 y.o. female presenting to the [facility] Department for STI screening  1. Screening for venereal disease Patient has 1 skin tag near rectum- states she has had this a long time and was evaluated by PCP for genital warts- but PCP states this is a skin tag. Patient has two lesions on her right vaginal wall as well as two lesions located on her cervix. Recent pap test in November 2023, pt states this was normal. They appear like Nabothian cysts.   - [facility] - Chlamydia/Gonorrhea [facility] - Syphilis Serology, [facility] - WET PREP FOR [place], [place], CLUE   Patient accepted all screenings including, vaginal CT/GC and bloodwork for HIV/RPR, and wet prep. Patient meets criteria for HepB screening? No. Ordered? not applicable Patient meets criteria for HepC screening? No. Ordered? not applicable  Treat wet prep per standing order Discussed time line for [facility] results and that patient will be called with positive results and encouraged patient to call if she had not heard in 2 weeks.  Counseled to return or seek care for continued or worsening symptoms Recommended condom use with all sex  Patient is currently using  nothing  to prevent pregnancy.    Return if symptoms worsen or fail to improve.  No future appointments.  Sharlet Salina, [place]
